# Patient Record
Sex: Male | Born: 1935 | Race: White | Hispanic: No | Marital: Married | Smoking: Former smoker
Health system: Southern US, Community
[De-identification: ages and names within clinical notes are randomized; demographics above are authoritative.]

## PROBLEM LIST (undated history)

## (undated) DIAGNOSIS — I10 Essential (primary) hypertension: Secondary | ICD-10-CM

## (undated) DIAGNOSIS — T4145XA Adverse effect of unspecified anesthetic, initial encounter: Secondary | ICD-10-CM

## (undated) DIAGNOSIS — N200 Calculus of kidney: Secondary | ICD-10-CM

## (undated) DIAGNOSIS — T8859XA Other complications of anesthesia, initial encounter: Secondary | ICD-10-CM

## (undated) HISTORY — PX: KNEE ARTHROSCOPY: SHX127

---

## 2009-02-12 ENCOUNTER — Ambulatory Visit: Payer: Self-pay | Admitting: Family Medicine

## 2009-12-16 ENCOUNTER — Ambulatory Visit: Payer: Self-pay

## 2010-01-14 ENCOUNTER — Ambulatory Visit: Payer: Self-pay | Admitting: General Practice

## 2010-05-16 ENCOUNTER — Ambulatory Visit: Payer: Self-pay | Admitting: General Practice

## 2014-10-06 DIAGNOSIS — M1712 Unilateral primary osteoarthritis, left knee: Secondary | ICD-10-CM | POA: Insufficient documentation

## 2014-11-12 ENCOUNTER — Other Ambulatory Visit: Payer: Self-pay | Admitting: Physician Assistant

## 2014-11-12 DIAGNOSIS — S83222D Peripheral tear of medial meniscus, current injury, left knee, subsequent encounter: Secondary | ICD-10-CM

## 2014-11-19 ENCOUNTER — Ambulatory Visit: Payer: Self-pay

## 2014-11-19 ENCOUNTER — Ambulatory Visit
Admission: RE | Admit: 2014-11-19 | Discharge: 2014-11-19 | Disposition: A | Discharge status: Home / Self Care | Payer: PPO | Source: Ambulatory Visit | Attending: Physician Assistant | Admitting: Physician Assistant

## 2014-11-19 DIAGNOSIS — M7122 Synovial cyst of popliteal space [Baker], left knee: Secondary | ICD-10-CM | POA: Insufficient documentation

## 2014-11-19 DIAGNOSIS — X58XXXD Exposure to other specified factors, subsequent encounter: Secondary | ICD-10-CM | POA: Insufficient documentation

## 2014-11-19 DIAGNOSIS — S83222D Peripheral tear of medial meniscus, current injury, left knee, subsequent encounter: Secondary | ICD-10-CM | POA: Insufficient documentation

## 2014-12-28 ENCOUNTER — Encounter
Admission: RE | Admit: 2014-12-28 | Discharge: 2014-12-28 | Disposition: A | Discharge status: Home / Self Care | Payer: PPO | Source: Ambulatory Visit | Attending: Orthopedic Surgery | Admitting: Orthopedic Surgery

## 2014-12-28 DIAGNOSIS — Z9109 Other allergy status, other than to drugs and biological substances: Secondary | ICD-10-CM | POA: Diagnosis not present

## 2014-12-28 DIAGNOSIS — I10 Essential (primary) hypertension: Secondary | ICD-10-CM | POA: Insufficient documentation

## 2014-12-28 DIAGNOSIS — Z01810 Encounter for preprocedural cardiovascular examination: Secondary | ICD-10-CM | POA: Insufficient documentation

## 2014-12-28 DIAGNOSIS — N200 Calculus of kidney: Secondary | ICD-10-CM | POA: Insufficient documentation

## 2014-12-28 DIAGNOSIS — M1712 Unilateral primary osteoarthritis, left knee: Secondary | ICD-10-CM | POA: Diagnosis not present

## 2014-12-28 HISTORY — DX: Adverse effect of unspecified anesthetic, initial encounter: T41.45XA

## 2014-12-28 HISTORY — DX: Essential (primary) hypertension: I10

## 2014-12-28 HISTORY — DX: Other complications of anesthesia, initial encounter: T88.59XA

## 2014-12-28 HISTORY — DX: Calculus of kidney: N20.0

## 2014-12-28 NOTE — Patient Instructions (Signed)
  Your procedure is scheduled on: 01/04/15 Fri Report to Day Surgery. To find out your arrival time please call [phone] between 1PM - 3PM on 01/01/15 Fri.  Remember: Instructions that are not followed completely may result in serious medical risk, up to and including death, or upon the discretion of your surgeon and anesthesiologist your surgery may need to be rescheduled.    _x___ 1. Do not eat food or drink liquids after midnight. No gum chewing or hard candies.     ____ 2. No Alcohol for 24 hours before or after surgery.   ____ 3. Bring all medications with you on the day of surgery if instructed.    __x__ 4. Notify your doctor if there is any change in your medical condition     (cold, fever, infections).     Do not wear jewelry, make-up, hairpins, clips or nail polish.  Do not wear lotions, powders, or perfumes. You may wear deodorant.  Do not shave 48 hours prior to surgery. Men may shave face and neck.  Do not bring valuables to the hospital.    [facility] is not responsible for any belongings or valuables.               Contacts, dentures or bridgework may not be worn into surgery.  Leave your suitcase in the car. After surgery it may be brought to your room.  For patients admitted to the hospital, discharge time is determined by your                treatment team.   Patients discharged the day of surgery will not be allowed to drive home.   Please read over the following fact sheets that you were given:      _x___ Take these medicines the morning of surgery with A SIP OF WATER:    1.lisinopril (PRINIVIL,ZESTRIL) 40 MG tablet  2.   3.   4.  5.  6.  ____ Fleet Enema (as directed)   ____ Use CHG Soap as directed  ____ Use inhalers on the day of surgery  ____ Stop metformin 2 days prior to surgery    ____ Take 1/2 of usual insulin dose the night before surgery and none on the morning of surgery.   _x___ Stop Coumadin/Plavix/aspirin on today  ____ Stop  Anti-inflammatories on    ____ Stop supplements until after surgery.    ____ Bring C-Pap to the hospital.

## 2015-01-04 ENCOUNTER — Encounter: Payer: Self-pay | Admitting: Anesthesiology

## 2015-01-04 ENCOUNTER — Ambulatory Visit: Payer: PPO | Admitting: Anesthesiology

## 2015-01-04 ENCOUNTER — Ambulatory Visit
Admission: RE | Admit: 2015-01-04 | Discharge: 2015-01-04 | Disposition: A | Discharge status: Home / Self Care | Payer: PPO | Source: Ambulatory Visit | Attending: Orthopedic Surgery | Admitting: Orthopedic Surgery

## 2015-01-04 ENCOUNTER — Encounter
Admission: RE | Disposition: A | Discharge status: Home / Self Care | Payer: Self-pay | Source: Ambulatory Visit | Attending: Orthopedic Surgery

## 2015-01-04 DIAGNOSIS — S83242A Other tear of medial meniscus, current injury, left knee, initial encounter: Secondary | ICD-10-CM | POA: Insufficient documentation

## 2015-01-04 DIAGNOSIS — M94262 Chondromalacia, left knee: Secondary | ICD-10-CM | POA: Diagnosis not present

## 2015-01-04 DIAGNOSIS — Z79899 Other long term (current) drug therapy: Secondary | ICD-10-CM | POA: Insufficient documentation

## 2015-01-04 DIAGNOSIS — X58XXXA Exposure to other specified factors, initial encounter: Secondary | ICD-10-CM | POA: Diagnosis not present

## 2015-01-04 DIAGNOSIS — M2392 Unspecified internal derangement of left knee: Secondary | ICD-10-CM | POA: Diagnosis present

## 2015-01-04 DIAGNOSIS — Z87891 Personal history of nicotine dependence: Secondary | ICD-10-CM | POA: Insufficient documentation

## 2015-01-04 DIAGNOSIS — I10 Essential (primary) hypertension: Secondary | ICD-10-CM | POA: Diagnosis not present

## 2015-01-04 DIAGNOSIS — Z7982 Long term (current) use of aspirin: Secondary | ICD-10-CM | POA: Diagnosis not present

## 2015-01-04 HISTORY — PX: KNEE ARTHROSCOPY: SHX127

## 2015-01-04 SURGERY — ARTHROSCOPY, KNEE
Anesthesia: General | Laterality: Left

## 2015-01-04 MED ORDER — MORPHINE SULFATE 10 MG/ML IJ SOLN
INTRAMUSCULAR | Status: AC
  Filled 2015-01-04: qty 1

## 2015-01-04 MED ORDER — BUPIVACAINE-EPINEPHRINE (PF) 0.25% -1:200000 IJ SOLN
INTRAMUSCULAR | Status: AC
  Filled 2015-01-04: qty 30

## 2015-01-04 MED ORDER — PROPOFOL 10 MG/ML IV BOLUS
INTRAVENOUS | Status: DC | PRN
  Administered 2015-01-04: 200 mg via INTRAVENOUS

## 2015-01-04 MED ORDER — ACETAMINOPHEN 10 MG/ML IV SOLN
INTRAVENOUS | Status: DC | PRN
  Administered 2015-01-04: 1000 mg via INTRAVENOUS

## 2015-01-04 MED ORDER — ONDANSETRON HCL 4 MG/2ML IJ SOLN: 4.0000 mg | Freq: Four times a day (QID) | INTRAMUSCULAR | Status: DC | PRN

## 2015-01-04 MED ORDER — ACETAMINOPHEN 10 MG/ML IV SOLN
INTRAVENOUS | Status: AC
  Filled 2015-01-04: qty 100

## 2015-01-04 MED ORDER — METOCLOPRAMIDE HCL 10 MG PO TABS: 5.0000 mg | ORAL_TABLET | Freq: Three times a day (TID) | ORAL | Status: DC | PRN

## 2015-01-04 MED ORDER — HYDROCODONE-ACETAMINOPHEN 5-325 MG PO TABS: 1.0000 | ORAL_TABLET | ORAL | Status: DC | PRN

## 2015-01-04 MED ORDER — FENTANYL CITRATE (PF) 100 MCG/2ML IJ SOLN
INTRAMUSCULAR | Status: DC | PRN
  Administered 2015-01-04 (×2): 50 ug via INTRAVENOUS

## 2015-01-04 MED ORDER — PHENYLEPHRINE HCL 10 MG/ML IJ SOLN
INTRAMUSCULAR | Status: DC | PRN
  Administered 2015-01-04: 100 ug via INTRAVENOUS
  Administered 2015-01-04: 50 ug via INTRAVENOUS
  Administered 2015-01-04 (×2): 100 ug via INTRAVENOUS

## 2015-01-04 MED ORDER — FAMOTIDINE 20 MG PO TABS
ORAL_TABLET | ORAL | Status: AC
  Filled 2015-01-04: qty 1

## 2015-01-04 MED ORDER — METOCLOPRAMIDE HCL 5 MG/ML IJ SOLN: 5.0000 mg | Freq: Three times a day (TID) | INTRAMUSCULAR | Status: DC | PRN

## 2015-01-04 MED ORDER — BUPIVACAINE-EPINEPHRINE (PF) 0.25% -1:200000 IJ SOLN
INTRAMUSCULAR | Status: DC | PRN
  Administered 2015-01-04: 30 mL via PERINEURAL

## 2015-01-04 MED ORDER — MORPHINE SULFATE 4 MG/ML IJ SOLN
INTRAMUSCULAR | Status: DC | PRN
  Administered 2015-01-04: 4 mg

## 2015-01-04 MED ORDER — LACTATED RINGERS IV SOLN
INTRAVENOUS | Status: DC
  Administered 2015-01-04: 16:00:00 via INTRAVENOUS

## 2015-01-04 MED ORDER — SODIUM CHLORIDE 0.9 % IV SOLN: INTRAVENOUS | Status: DC

## 2015-01-04 MED ORDER — EPHEDRINE SULFATE 50 MG/ML IJ SOLN
INTRAMUSCULAR | Status: DC | PRN
Start: 2015-01-04 — End: 2015-01-04
  Administered 2015-01-04: 5 mg via INTRAVENOUS
  Administered 2015-01-04: 10 mg via INTRAVENOUS
  Administered 2015-01-04 (×3): 5 mg via INTRAVENOUS

## 2015-01-04 MED ORDER — MORPHINE SULFATE 4 MG/ML IJ SOLN
INTRAMUSCULAR | Status: AC
  Filled 2015-01-04: qty 1

## 2015-01-04 MED ORDER — FAMOTIDINE 20 MG PO TABS
20.0000 mg | ORAL_TABLET | Freq: Once | ORAL | Status: AC
  Administered 2015-01-04: 20 mg via ORAL

## 2015-01-04 MED ORDER — LIDOCAINE HCL (CARDIAC) 20 MG/ML IV SOLN
INTRAVENOUS | Status: DC | PRN
  Administered 2015-01-04: 20 mg via INTRAVENOUS

## 2015-01-04 MED ORDER — ONDANSETRON HCL 4 MG PO TABS: 4.0000 mg | ORAL_TABLET | Freq: Four times a day (QID) | ORAL | Status: DC | PRN

## 2015-01-04 SURGICAL SUPPLY — 22 items
BLADE SHAVER 4.5 DBL SERAT CV (CUTTER) ×3 IMPLANT
BNDG ESMARK 6X12 TAN STRL LF (GAUZE/BANDAGES/DRESSINGS) ×3 IMPLANT
DRSG DERMACEA 8X12 NADH (GAUZE/BANDAGES/DRESSINGS) ×3 IMPLANT
DURAPREP 26ML APPLICATOR (WOUND CARE) ×6 IMPLANT
GAUZE SPONGE 4X4 12PLY STRL (GAUZE/BANDAGES/DRESSINGS) ×3 IMPLANT
GLOVE BIOGEL M STRL SZ7.5 (GLOVE) ×3 IMPLANT
GLOVE INDICATOR 8.0 STRL GRN (GLOVE) ×3 IMPLANT
GOWN STRL REUS W/ TWL LRG LVL3 (GOWN DISPOSABLE) ×1 IMPLANT
GOWN STRL REUS W/TWL LRG LVL3 (GOWN DISPOSABLE) ×2
GOWN STRL REUS W/TWL XL LVL4 (GOWN DISPOSABLE) ×3 IMPLANT
IV LACTATED RINGER IRRG 3000ML (IV SOLUTION) ×12
IV LR IRRIG 3000ML ARTHROMATIC (IV SOLUTION) ×6 IMPLANT
MANIFOLD NEPTUNE II (INSTRUMENTS) ×3 IMPLANT
PACK ARTHROSCOPY KNEE (MISCELLANEOUS) ×3 IMPLANT
SET TUBE SUCT SHAVER OUTFL 24K (TUBING) ×3 IMPLANT
SET TUBE TIP INTRA-ARTICULAR (MISCELLANEOUS) ×3 IMPLANT
STRAP SAFETY BODY (MISCELLANEOUS) ×3 IMPLANT
SUT ETHILON 3-0 FS-10 30 BLK (SUTURE) ×3
SUTURE EHLN 3-0 FS-10 30 BLK (SUTURE) ×1 IMPLANT
TUBING ARTHRO INFLOW-ONLY STRL (TUBING) ×3 IMPLANT
WAND HAND CNTRL MULTIVAC 50 (MISCELLANEOUS) ×3 IMPLANT
WRAP KNEE W/COLD PACKS 25.5X14 (SOFTGOODS) ×3 IMPLANT

## 2015-01-04 NOTE — Transfer of Care (Signed)
Immediate Anesthesia Transfer of Care Note  Patient: Dakota Parker  Procedure(s) Performed: Procedure(s): Left knee arthroscopy parital medial menisectomy, chondraplasty, (Left)  Patient Location: PACU  Anesthesia Type:General  Level of Consciousness: awake  Airway & Oxygen Therapy: Patient Spontanous Breathing and Patient connected to face mask oxygen  Post-op Assessment: Report given to RN and Post -op Vital signs reviewed and stable  Post vital signs: Reviewed and stable  Last Vitals:  Filed Vitals:   01/04/15 1907  BP: 114/67  Pulse: 68  Temp: 37.1 C  Resp: 12    Complications: No apparent anesthesia complications

## 2015-01-04 NOTE — H&P (Signed)
The patient has been re-examined, and the chart reviewed, and there have been no interval changes to the documented history and physical.    The risks, benefits, and alternatives have been discussed at length. The patient expressed understanding of the risks benefits and agreed with plans for surgical intervention.  James P. Hooten, Jr. M.D.    

## 2015-01-04 NOTE — Anesthesia Preprocedure Evaluation (Signed)
Anesthesia Evaluation  Patient identified by MRN, date of birth, ID band Patient awake    Reviewed: Allergy & Precautions, H&P , NPO status , Patient's Chart, lab work & pertinent test results, reviewed documented beta blocker date and time   History of Anesthesia Complications Negative for: history of anesthetic complications  Airway Mallampati: III  TM Distance: >3 FB Neck ROM: limited    Dental  (+) Poor Dentition   Pulmonary former smoker,  breath sounds clear to auscultation  Pulmonary exam normal       Cardiovascular Exercise Tolerance: Good hypertension, Normal cardiovascular examRhythm:regular Rate:Normal     Neuro/Psych negative neurological ROS  negative psych ROS   GI/Hepatic negative GI ROS, Neg liver ROS,   Endo/Other  negative endocrine ROS  Renal/GU Renal disease  negative genitourinary   Musculoskeletal   Abdominal   Peds  Hematology negative hematology ROS (+)   Anesthesia Other Findings Past Medical History:   Hypertension                                                                                 Kidney stones                                              Patient reports no Complication of anesthesia  Signs and symptoms suggestive of sleep apnea    Reproductive/Obstetrics negative OB ROS                             Anesthesia Physical Anesthesia Plan  ASA: II  Anesthesia Plan: General LMA   Post-op Pain Management:    Induction:   Airway Management Planned:   Additional Equipment:   Intra-op Plan:   Post-operative Plan:   Informed Consent: I have reviewed the patients History and Physical, chart, labs and discussed the procedure including the risks, benefits and alternatives for the proposed anesthesia with the patient or authorized representative who has indicated his/her understanding and acceptance.   Dental Advisory Given  Plan Discussed with:  Anesthesiologist, CRNA and Surgeon  Anesthesia Plan Comments:         Anesthesia Quick Evaluation

## 2015-01-04 NOTE — Op Note (Signed)
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   ASSISTANT: none  ANESTHESIA: general  ESTIMATED BLOOD LOSS: Minimal  TOURNIQUET TIME: Not used   DRAINS: none  IMPLANTS UTILIZED: None  INDICATIONS FOR SURGERY: Dakota Parker is a 81 y.o. year old male who has been seen for complaints of left knee pain. MRI demonstrated findings consistent with meniscal pathology. After discussion of the risks and benefits of surgical intervention, the patient expressed understanding of the risks benefits and agree with plans for left knee arthroscopy.   PROCEDURE IN DETAIL: The patient was brought into the operating room and, after adequate general anesthesia was achieved, a tourniquet was applied to the left thigh and the leg was placed in the leg holder. All bony prominences were well padded. The patient's left knee was cleaned and prepped with alcohol and Duraprep and draped in the usual sterile fashion. A "timeout" was performed as per usual protocol. The anticipated portal sites were injected with 0.25% Marcaine with epinephrine. An anterolateral incision was made and a cannula was inserted. A small effusion was evacuated and the knee was distended with fluid using the pump. The scope was advanced down the medial gutter into the medial compartment. Under visualization with the scope, an anteromedial portal was created and a hooked probe was inserted. The medial meniscus was visualized and probed. Mild fraying was noted along the posterior horn of the medial meniscus. There was a degenerative tear  involving the anterior horn of the medial meniscus. The tear was debrided using a 4.5 mm incisor shaver and then contoured using a 50 ArthroCare wand. The remaining rim of meniscus was probed and felt to be stable. The articular cartilage was visualized. There were grade 2-3 changes of chondromalacia involving primarily the medial femoral condyle. These areas were debrided and contoured using the 50 ArthroCare wand.  The scope was then advanced into the intercondylar notch. The anterior cruciate ligament was visualized and probed and felt to be intact. The scope was removed from the lateral portal and reinserted via the anteromedial portal to better visualize the lateral compartment. The lateral meniscus was visualized and probed. Minimal fraying was noted to inner aspect of the lateral meniscus with no gross tear or instability. The articular cartilage of the lateral compartment was visualized and was noted to be in excellent condition. Finally, the scope was advanced so as to visualize the patellofemoral articulation. Good patellar tracking was appreciaarticular surface was in excellent condition.he knee was irrigated with copius amounts of fluid and suctioned dry. The anterolateral portal was re-approximated with #3-0 nylon. A combination of 0.25% Marcaine with epinephrine and 4 mg of Morphine were injected via the scope. The scope was removed and the anteromedial portal was re-approximated with #3-0 nylon. A sterile dressing was applied followed by application of an ice wrap.  The patient tolerated the procedure well and was transported to the PACU in stable condition.  Avery Klingbeil P. Holley Bouche., M.D.

## 2015-01-04 NOTE — Brief Op Note (Signed)
01/04/2015  7:09 PM  PATIENT:  Dakota Parker  81 y.o. 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   Delay start of Pharmacological VTE agent (>24hrs) due to surgical blood loss or risk of bleeding: not applicable

## 2015-01-04 NOTE — Discharge Instructions (Signed)
°  Instructions after Knee Arthroscopy  ° ° James P. Hooten, Jr., M.D.    ° Dept. of Orthopaedics & Sports Medicine ° [facility] ° [address] ° [place], [place]  [address] ° ° Phone: [phone]   Fax: [phone] ° ° °DIET: °• Drink plenty of non-alcoholic fluids & begin a light diet. °• Resume your normal diet the day after surgery. ° °ACTIVITY:  °• You may use crutches or a walker with weight-bearing as tolerated, unless instructed otherwise. °• You may wean yourself off of the walker or crutches as tolerated.  °• Begin doing gentle exercises. Exercising will reduce the pain and swelling, increase motion, and prevent muscle weakness.   °• Avoid strenuous activities or athletics for a minimum of 4-6 weeks after arthroscopic surgery. °• Do not drive or operate any equipment until instructed. ° °WOUND CARE:  °• Place one to two pillows under the knee the first day or two when sitting or lying.  °• Continue to use the ice packs periodically to reduce pain and swelling. °• The small incisions in your knee are closed with nylon stitches. The stitches will be removed in the office. °• The bulky dressing may be removed on the second day after surgery. DO NOT TOUCH THE STITCHES. Put a Band-Aid over each stitch. Do NOT use any ointments or creams on the incisions.  °• You may bathe or shower after the stitches are removed at the first office visit following surgery. ° °MEDICATIONS: °• You may resume your regular medications. °• Please take the pain medication as prescribed. °• Do not take pain medication on an empty stomach. °• Do not drive or drink alcoholic beverages when taking pain medications. ° °CALL THE OFFICE FOR: °• Temperature above 101 degrees °• Excessive bleeding or drainage on the dressing. °• Excessive swelling, coldness, or paleness of the toes. °• Persistent nausea and vomiting. ° °FOLLOW-UP:  °• You should have an appointment to return to the office in 7-10 days after surgery.  °  °

## 2015-01-04 NOTE — Anesthesia Postprocedure Evaluation (Signed)
  Anesthesia Post-op Note  Patient: Dakota Parker  Procedure(s) Performed: Procedure(s): Left knee arthroscopy parital medial menisectomy, chondraplasty, (Left)  Anesthesia type:General LMA  Patient location: PACU  Post pain: Pain level controlled  Post assessment: Post-op Vital signs reviewed, Patient's Cardiovascular Status Stable, Respiratory Function Stable, Patent Airway and No signs of Nausea or vomiting  Post vital signs: Reviewed and stable  Last Vitals:  Filed Vitals:   01/04/15 1907  BP: 114/67  Pulse: 68  Temp: 37.1 C  Resp: 12    Level of consciousness: awake, alert  and patient cooperative  Complications: No apparent anesthesia complications

## 2015-01-04 NOTE — Anesthesia Procedure Notes (Signed)
Procedure Name: LMA Insertion Date/Time: 01/04/2015 5:40 PM Performed by: Aline Brochure Pre-anesthesia Checklist: Patient identified, Emergency Drugs available, Suction available and Patient being monitored Patient Re-evaluated:Patient Re-evaluated prior to inductionOxygen Delivery Method: Circle system utilized Preoxygenation: Pre-oxygenation with 100% oxygen Intubation Type: IV induction Ventilation: Mask ventilation without difficulty LMA: LMA inserted LMA Size: 4.5 Number of attempts: 1 Airway Equipment and Method: Patient positioned with wedge pillow Placement Confirmation: positive ETCO2 and breath sounds checked- equal and bilateral Tube secured with: Tape Dental Injury: Teeth and Oropharynx as per pre-operative assessment

## 2015-01-05 ENCOUNTER — Encounter: Payer: Self-pay | Admitting: Orthopedic Surgery

## 2015-06-15 DIAGNOSIS — C44219 Basal cell carcinoma of skin of left ear and external auricular canal: Secondary | ICD-10-CM | POA: Diagnosis not present

## 2015-06-15 DIAGNOSIS — L578 Other skin changes due to chronic exposure to nonionizing radiation: Secondary | ICD-10-CM | POA: Diagnosis not present

## 2015-06-15 DIAGNOSIS — C44319 Basal cell carcinoma of skin of other parts of face: Secondary | ICD-10-CM | POA: Diagnosis not present

## 2015-06-15 DIAGNOSIS — C44311 Basal cell carcinoma of skin of nose: Secondary | ICD-10-CM | POA: Diagnosis not present

## 2015-06-15 DIAGNOSIS — C4491 Basal cell carcinoma of skin, unspecified: Secondary | ICD-10-CM | POA: Diagnosis not present

## 2015-06-15 DIAGNOSIS — L908 Other atrophic disorders of skin: Secondary | ICD-10-CM | POA: Diagnosis not present

## 2015-06-15 DIAGNOSIS — C44212 Basal cell carcinoma of skin of right ear and external auricular canal: Secondary | ICD-10-CM | POA: Diagnosis not present

## 2015-06-15 DIAGNOSIS — L814 Other melanin hyperpigmentation: Secondary | ICD-10-CM | POA: Diagnosis not present

## 2015-07-01 DIAGNOSIS — H40003 Preglaucoma, unspecified, bilateral: Secondary | ICD-10-CM | POA: Diagnosis not present

## 2015-10-05 DIAGNOSIS — L821 Other seborrheic keratosis: Secondary | ICD-10-CM | POA: Diagnosis not present

## 2015-10-05 DIAGNOSIS — Z85828 Personal history of other malignant neoplasm of skin: Secondary | ICD-10-CM | POA: Diagnosis not present

## 2015-10-05 DIAGNOSIS — D1800 Hemangioma unspecified site: Secondary | ICD-10-CM | POA: Diagnosis not present

## 2015-10-05 DIAGNOSIS — L570 Actinic keratosis: Secondary | ICD-10-CM | POA: Diagnosis not present

## 2015-10-05 DIAGNOSIS — D485 Neoplasm of uncertain behavior of skin: Secondary | ICD-10-CM | POA: Diagnosis not present

## 2015-11-09 DIAGNOSIS — C4441 Basal cell carcinoma of skin of scalp and neck: Secondary | ICD-10-CM | POA: Diagnosis not present

## 2015-11-09 DIAGNOSIS — C4491 Basal cell carcinoma of skin, unspecified: Secondary | ICD-10-CM | POA: Diagnosis not present

## 2016-02-17 DIAGNOSIS — I10 Essential (primary) hypertension: Secondary | ICD-10-CM | POA: Diagnosis not present

## 2016-02-17 DIAGNOSIS — N183 Chronic kidney disease, stage 3 (moderate): Secondary | ICD-10-CM | POA: Diagnosis not present

## 2016-02-17 DIAGNOSIS — R0989 Other specified symptoms and signs involving the circulatory and respiratory systems: Secondary | ICD-10-CM | POA: Diagnosis not present

## 2016-02-17 DIAGNOSIS — E119 Type 2 diabetes mellitus without complications: Secondary | ICD-10-CM | POA: Diagnosis not present

## 2016-06-08 DIAGNOSIS — I10 Essential (primary) hypertension: Secondary | ICD-10-CM | POA: Diagnosis not present

## 2016-06-08 DIAGNOSIS — R0989 Other specified symptoms and signs involving the circulatory and respiratory systems: Secondary | ICD-10-CM | POA: Diagnosis not present

## 2016-06-08 DIAGNOSIS — Z87891 Personal history of nicotine dependence: Secondary | ICD-10-CM | POA: Diagnosis not present

## 2016-06-08 DIAGNOSIS — N183 Chronic kidney disease, stage 3 (moderate): Secondary | ICD-10-CM | POA: Diagnosis not present

## 2016-06-08 DIAGNOSIS — Z23 Encounter for immunization: Secondary | ICD-10-CM | POA: Diagnosis not present

## 2016-06-08 DIAGNOSIS — J90 Pleural effusion, not elsewhere classified: Secondary | ICD-10-CM | POA: Diagnosis not present

## 2016-06-08 DIAGNOSIS — E119 Type 2 diabetes mellitus without complications: Secondary | ICD-10-CM | POA: Diagnosis not present

## 2016-06-22 DIAGNOSIS — N183 Chronic kidney disease, stage 3 (moderate): Secondary | ICD-10-CM | POA: Diagnosis not present

## 2016-06-22 DIAGNOSIS — R0989 Other specified symptoms and signs involving the circulatory and respiratory systems: Secondary | ICD-10-CM | POA: Diagnosis not present

## 2016-06-22 DIAGNOSIS — R7309 Other abnormal glucose: Secondary | ICD-10-CM | POA: Diagnosis not present

## 2016-06-22 DIAGNOSIS — E785 Hyperlipidemia, unspecified: Secondary | ICD-10-CM | POA: Diagnosis not present

## 2016-06-22 DIAGNOSIS — I10 Essential (primary) hypertension: Secondary | ICD-10-CM | POA: Diagnosis not present

## 2016-10-24 DIAGNOSIS — Z85828 Personal history of other malignant neoplasm of skin: Secondary | ICD-10-CM | POA: Diagnosis not present

## 2016-10-24 DIAGNOSIS — L570 Actinic keratosis: Secondary | ICD-10-CM | POA: Diagnosis not present

## 2017-02-06 DIAGNOSIS — H02826 Cysts of left eye, unspecified eyelid: Secondary | ICD-10-CM | POA: Diagnosis not present

## 2017-07-20 DIAGNOSIS — B9689 Other specified bacterial agents as the cause of diseases classified elsewhere: Secondary | ICD-10-CM | POA: Diagnosis not present

## 2017-07-20 DIAGNOSIS — J019 Acute sinusitis, unspecified: Secondary | ICD-10-CM | POA: Diagnosis not present

## 2017-07-20 DIAGNOSIS — J209 Acute bronchitis, unspecified: Secondary | ICD-10-CM | POA: Diagnosis not present

## 2017-12-25 DIAGNOSIS — E119 Type 2 diabetes mellitus without complications: Secondary | ICD-10-CM | POA: Diagnosis not present

## 2017-12-25 DIAGNOSIS — E785 Hyperlipidemia, unspecified: Secondary | ICD-10-CM | POA: Diagnosis not present

## 2017-12-25 DIAGNOSIS — I10 Essential (primary) hypertension: Secondary | ICD-10-CM | POA: Diagnosis not present

## 2017-12-25 DIAGNOSIS — N183 Chronic kidney disease, stage 3 (moderate): Secondary | ICD-10-CM | POA: Diagnosis not present

## 2018-02-07 DIAGNOSIS — N183 Chronic kidney disease, stage 3 (moderate): Secondary | ICD-10-CM | POA: Diagnosis not present

## 2018-02-07 DIAGNOSIS — I10 Essential (primary) hypertension: Secondary | ICD-10-CM | POA: Diagnosis not present

## 2018-02-07 DIAGNOSIS — E119 Type 2 diabetes mellitus without complications: Secondary | ICD-10-CM | POA: Diagnosis not present

## 2018-02-12 DIAGNOSIS — E119 Type 2 diabetes mellitus without complications: Secondary | ICD-10-CM | POA: Diagnosis not present

## 2018-02-12 DIAGNOSIS — I10 Essential (primary) hypertension: Secondary | ICD-10-CM | POA: Diagnosis not present

## 2018-02-12 DIAGNOSIS — N183 Chronic kidney disease, stage 3 (moderate): Secondary | ICD-10-CM | POA: Diagnosis not present

## 2018-03-20 DIAGNOSIS — I10 Essential (primary) hypertension: Secondary | ICD-10-CM | POA: Diagnosis not present

## 2018-03-20 DIAGNOSIS — E119 Type 2 diabetes mellitus without complications: Secondary | ICD-10-CM | POA: Diagnosis not present

## 2018-04-24 DIAGNOSIS — E119 Type 2 diabetes mellitus without complications: Secondary | ICD-10-CM | POA: Diagnosis not present

## 2018-04-24 DIAGNOSIS — N183 Chronic kidney disease, stage 3 (moderate): Secondary | ICD-10-CM | POA: Diagnosis not present

## 2018-04-24 DIAGNOSIS — Z23 Encounter for immunization: Secondary | ICD-10-CM | POA: Diagnosis not present

## 2018-04-25 DIAGNOSIS — E1121 Type 2 diabetes mellitus with diabetic nephropathy: Secondary | ICD-10-CM | POA: Diagnosis not present

## 2018-05-31 DIAGNOSIS — I10 Essential (primary) hypertension: Secondary | ICD-10-CM | POA: Diagnosis not present

## 2018-05-31 DIAGNOSIS — E785 Hyperlipidemia, unspecified: Secondary | ICD-10-CM | POA: Diagnosis not present

## 2018-07-09 DIAGNOSIS — E1121 Type 2 diabetes mellitus with diabetic nephropathy: Secondary | ICD-10-CM | POA: Diagnosis not present

## 2018-07-30 DIAGNOSIS — I10 Essential (primary) hypertension: Secondary | ICD-10-CM | POA: Diagnosis not present

## 2018-07-30 DIAGNOSIS — Z1389 Encounter for screening for other disorder: Secondary | ICD-10-CM | POA: Diagnosis not present

## 2018-07-30 DIAGNOSIS — Z0000 Encounter for general adult medical examination without abnormal findings: Secondary | ICD-10-CM | POA: Diagnosis not present

## 2018-07-30 DIAGNOSIS — E1121 Type 2 diabetes mellitus with diabetic nephropathy: Secondary | ICD-10-CM | POA: Diagnosis not present

## 2018-08-29 DIAGNOSIS — E1121 Type 2 diabetes mellitus with diabetic nephropathy: Secondary | ICD-10-CM | POA: Diagnosis not present

## 2018-08-29 DIAGNOSIS — I10 Essential (primary) hypertension: Secondary | ICD-10-CM | POA: Diagnosis not present

## 2018-10-01 DIAGNOSIS — E785 Hyperlipidemia, unspecified: Secondary | ICD-10-CM | POA: Diagnosis not present

## 2018-10-01 DIAGNOSIS — I10 Essential (primary) hypertension: Secondary | ICD-10-CM | POA: Diagnosis not present

## 2018-10-01 DIAGNOSIS — E1121 Type 2 diabetes mellitus with diabetic nephropathy: Secondary | ICD-10-CM | POA: Diagnosis not present

## 2018-10-01 DIAGNOSIS — N183 Chronic kidney disease, stage 3 (moderate): Secondary | ICD-10-CM | POA: Diagnosis not present

## 2018-10-30 DIAGNOSIS — E1121 Type 2 diabetes mellitus with diabetic nephropathy: Secondary | ICD-10-CM | POA: Diagnosis not present

## 2018-10-30 DIAGNOSIS — I10 Essential (primary) hypertension: Secondary | ICD-10-CM | POA: Diagnosis not present

## 2018-11-18 DIAGNOSIS — E1121 Type 2 diabetes mellitus with diabetic nephropathy: Secondary | ICD-10-CM | POA: Diagnosis not present

## 2018-11-18 DIAGNOSIS — I10 Essential (primary) hypertension: Secondary | ICD-10-CM | POA: Diagnosis not present

## 2018-11-26 DIAGNOSIS — E1121 Type 2 diabetes mellitus with diabetic nephropathy: Secondary | ICD-10-CM | POA: Diagnosis not present

## 2018-11-26 DIAGNOSIS — E785 Hyperlipidemia, unspecified: Secondary | ICD-10-CM | POA: Diagnosis not present

## 2018-11-26 DIAGNOSIS — N183 Chronic kidney disease, stage 3 (moderate): Secondary | ICD-10-CM | POA: Diagnosis not present

## 2018-11-26 DIAGNOSIS — I10 Essential (primary) hypertension: Secondary | ICD-10-CM | POA: Diagnosis not present

## 2018-11-27 DIAGNOSIS — I10 Essential (primary) hypertension: Secondary | ICD-10-CM | POA: Diagnosis not present

## 2018-11-27 DIAGNOSIS — E1121 Type 2 diabetes mellitus with diabetic nephropathy: Secondary | ICD-10-CM | POA: Diagnosis not present

## 2018-11-27 DIAGNOSIS — E785 Hyperlipidemia, unspecified: Secondary | ICD-10-CM | POA: Diagnosis not present

## 2018-11-29 DIAGNOSIS — C44319 Basal cell carcinoma of skin of other parts of face: Secondary | ICD-10-CM | POA: Diagnosis not present

## 2018-11-29 DIAGNOSIS — C44311 Basal cell carcinoma of skin of nose: Secondary | ICD-10-CM | POA: Diagnosis not present

## 2018-11-29 DIAGNOSIS — D489 Neoplasm of uncertain behavior, unspecified: Secondary | ICD-10-CM | POA: Diagnosis not present

## 2018-11-29 DIAGNOSIS — Z85828 Personal history of other malignant neoplasm of skin: Secondary | ICD-10-CM | POA: Diagnosis not present

## 2019-01-08 DIAGNOSIS — I10 Essential (primary) hypertension: Secondary | ICD-10-CM | POA: Diagnosis not present

## 2019-01-08 DIAGNOSIS — E1121 Type 2 diabetes mellitus with diabetic nephropathy: Secondary | ICD-10-CM | POA: Diagnosis not present

## 2019-01-20 DIAGNOSIS — Z85828 Personal history of other malignant neoplasm of skin: Secondary | ICD-10-CM | POA: Diagnosis not present

## 2019-01-20 DIAGNOSIS — C44311 Basal cell carcinoma of skin of nose: Secondary | ICD-10-CM | POA: Diagnosis not present

## 2019-01-20 DIAGNOSIS — C44319 Basal cell carcinoma of skin of other parts of face: Secondary | ICD-10-CM | POA: Diagnosis not present

## 2019-02-07 DIAGNOSIS — E1121 Type 2 diabetes mellitus with diabetic nephropathy: Secondary | ICD-10-CM | POA: Diagnosis not present

## 2019-02-07 DIAGNOSIS — N183 Chronic kidney disease, stage 3 (moderate): Secondary | ICD-10-CM | POA: Diagnosis not present

## 2019-03-05 DIAGNOSIS — I10 Essential (primary) hypertension: Secondary | ICD-10-CM | POA: Diagnosis not present

## 2019-03-05 DIAGNOSIS — Z1329 Encounter for screening for other suspected endocrine disorder: Secondary | ICD-10-CM | POA: Diagnosis not present

## 2019-03-05 DIAGNOSIS — E785 Hyperlipidemia, unspecified: Secondary | ICD-10-CM | POA: Diagnosis not present

## 2019-03-05 DIAGNOSIS — N183 Chronic kidney disease, stage 3 (moderate): Secondary | ICD-10-CM | POA: Diagnosis not present

## 2019-03-05 DIAGNOSIS — Z23 Encounter for immunization: Secondary | ICD-10-CM | POA: Diagnosis not present

## 2019-03-05 DIAGNOSIS — E1121 Type 2 diabetes mellitus with diabetic nephropathy: Secondary | ICD-10-CM | POA: Diagnosis not present

## 2019-03-06 DIAGNOSIS — N183 Chronic kidney disease, stage 3 (moderate): Secondary | ICD-10-CM | POA: Diagnosis not present

## 2019-03-06 DIAGNOSIS — I10 Essential (primary) hypertension: Secondary | ICD-10-CM | POA: Diagnosis not present

## 2019-03-31 DIAGNOSIS — I10 Essential (primary) hypertension: Secondary | ICD-10-CM | POA: Diagnosis not present

## 2019-03-31 DIAGNOSIS — E1121 Type 2 diabetes mellitus with diabetic nephropathy: Secondary | ICD-10-CM | POA: Diagnosis not present

## 2019-03-31 DIAGNOSIS — E785 Hyperlipidemia, unspecified: Secondary | ICD-10-CM | POA: Diagnosis not present

## 2019-04-22 DIAGNOSIS — E785 Hyperlipidemia, unspecified: Secondary | ICD-10-CM | POA: Diagnosis not present

## 2019-04-22 DIAGNOSIS — E1121 Type 2 diabetes mellitus with diabetic nephropathy: Secondary | ICD-10-CM | POA: Diagnosis not present

## 2019-04-22 DIAGNOSIS — N1832 Chronic kidney disease, stage 3b: Secondary | ICD-10-CM | POA: Diagnosis not present

## 2019-04-22 DIAGNOSIS — I10 Essential (primary) hypertension: Secondary | ICD-10-CM | POA: Diagnosis not present

## 2019-05-29 DIAGNOSIS — I10 Essential (primary) hypertension: Secondary | ICD-10-CM | POA: Diagnosis not present

## 2019-05-29 DIAGNOSIS — E1121 Type 2 diabetes mellitus with diabetic nephropathy: Secondary | ICD-10-CM | POA: Diagnosis not present

## 2019-06-16 DIAGNOSIS — D492 Neoplasm of unspecified behavior of bone, soft tissue, and skin: Secondary | ICD-10-CM | POA: Diagnosis not present

## 2019-06-16 DIAGNOSIS — R238 Other skin changes: Secondary | ICD-10-CM | POA: Diagnosis not present

## 2019-06-16 DIAGNOSIS — C44319 Basal cell carcinoma of skin of other parts of face: Secondary | ICD-10-CM | POA: Diagnosis not present

## 2019-06-16 DIAGNOSIS — Z85828 Personal history of other malignant neoplasm of skin: Secondary | ICD-10-CM | POA: Diagnosis not present

## 2019-06-24 DIAGNOSIS — E785 Hyperlipidemia, unspecified: Secondary | ICD-10-CM | POA: Diagnosis not present

## 2019-06-24 DIAGNOSIS — E1121 Type 2 diabetes mellitus with diabetic nephropathy: Secondary | ICD-10-CM | POA: Diagnosis not present

## 2019-06-24 DIAGNOSIS — I10 Essential (primary) hypertension: Secondary | ICD-10-CM | POA: Diagnosis not present

## 2019-07-02 DIAGNOSIS — Z23 Encounter for immunization: Secondary | ICD-10-CM | POA: Diagnosis not present

## 2019-07-21 DIAGNOSIS — L739 Follicular disorder, unspecified: Secondary | ICD-10-CM | POA: Diagnosis not present

## 2019-07-21 DIAGNOSIS — C44310 Basal cell carcinoma of skin of unspecified parts of face: Secondary | ICD-10-CM | POA: Diagnosis not present

## 2019-07-30 DIAGNOSIS — Z23 Encounter for immunization: Secondary | ICD-10-CM | POA: Diagnosis not present

## 2019-08-05 DIAGNOSIS — I10 Essential (primary) hypertension: Secondary | ICD-10-CM | POA: Diagnosis not present

## 2019-08-05 DIAGNOSIS — E1121 Type 2 diabetes mellitus with diabetic nephropathy: Secondary | ICD-10-CM | POA: Diagnosis not present

## 2019-09-02 DIAGNOSIS — I10 Essential (primary) hypertension: Secondary | ICD-10-CM | POA: Diagnosis not present

## 2019-09-02 DIAGNOSIS — E785 Hyperlipidemia, unspecified: Secondary | ICD-10-CM | POA: Diagnosis not present

## 2019-09-30 DIAGNOSIS — E1121 Type 2 diabetes mellitus with diabetic nephropathy: Secondary | ICD-10-CM | POA: Diagnosis not present

## 2019-09-30 DIAGNOSIS — I10 Essential (primary) hypertension: Secondary | ICD-10-CM | POA: Diagnosis not present

## 2019-11-14 DIAGNOSIS — C4491 Basal cell carcinoma of skin, unspecified: Secondary | ICD-10-CM | POA: Diagnosis not present

## 2019-12-08 DIAGNOSIS — I10 Essential (primary) hypertension: Secondary | ICD-10-CM | POA: Diagnosis not present

## 2019-12-08 DIAGNOSIS — E1121 Type 2 diabetes mellitus with diabetic nephropathy: Secondary | ICD-10-CM | POA: Diagnosis not present

## 2020-01-29 DIAGNOSIS — I10 Essential (primary) hypertension: Secondary | ICD-10-CM | POA: Diagnosis not present

## 2020-01-29 DIAGNOSIS — E1121 Type 2 diabetes mellitus with diabetic nephropathy: Secondary | ICD-10-CM | POA: Diagnosis not present

## 2020-03-05 DIAGNOSIS — N1832 Chronic kidney disease, stage 3b: Secondary | ICD-10-CM | POA: Diagnosis not present

## 2020-03-05 DIAGNOSIS — I10 Essential (primary) hypertension: Secondary | ICD-10-CM | POA: Diagnosis not present

## 2020-03-31 DIAGNOSIS — E1121 Type 2 diabetes mellitus with diabetic nephropathy: Secondary | ICD-10-CM | POA: Diagnosis not present

## 2020-03-31 DIAGNOSIS — N1832 Chronic kidney disease, stage 3b: Secondary | ICD-10-CM | POA: Diagnosis not present

## 2020-04-15 DIAGNOSIS — E785 Hyperlipidemia, unspecified: Secondary | ICD-10-CM | POA: Diagnosis not present

## 2020-04-15 DIAGNOSIS — Z23 Encounter for immunization: Secondary | ICD-10-CM | POA: Diagnosis not present

## 2020-04-15 DIAGNOSIS — N1832 Chronic kidney disease, stage 3b: Secondary | ICD-10-CM | POA: Diagnosis not present

## 2020-04-15 DIAGNOSIS — I10 Essential (primary) hypertension: Secondary | ICD-10-CM | POA: Diagnosis not present

## 2020-04-15 DIAGNOSIS — E1121 Type 2 diabetes mellitus with diabetic nephropathy: Secondary | ICD-10-CM | POA: Diagnosis not present

## 2020-04-20 DIAGNOSIS — E785 Hyperlipidemia, unspecified: Secondary | ICD-10-CM | POA: Diagnosis not present

## 2020-04-20 DIAGNOSIS — N1832 Chronic kidney disease, stage 3b: Secondary | ICD-10-CM | POA: Diagnosis not present

## 2020-04-20 DIAGNOSIS — I10 Essential (primary) hypertension: Secondary | ICD-10-CM | POA: Diagnosis not present

## 2020-04-20 DIAGNOSIS — E1121 Type 2 diabetes mellitus with diabetic nephropathy: Secondary | ICD-10-CM | POA: Diagnosis not present

## 2020-04-29 DIAGNOSIS — I10 Essential (primary) hypertension: Secondary | ICD-10-CM | POA: Diagnosis not present

## 2020-04-29 DIAGNOSIS — E1121 Type 2 diabetes mellitus with diabetic nephropathy: Secondary | ICD-10-CM | POA: Diagnosis not present

## 2020-05-27 DIAGNOSIS — E1122 Type 2 diabetes mellitus with diabetic chronic kidney disease: Secondary | ICD-10-CM | POA: Diagnosis not present

## 2020-05-27 DIAGNOSIS — N1832 Chronic kidney disease, stage 3b: Secondary | ICD-10-CM | POA: Diagnosis not present

## 2020-05-27 DIAGNOSIS — I10 Essential (primary) hypertension: Secondary | ICD-10-CM | POA: Diagnosis not present

## 2020-06-15 DIAGNOSIS — N400 Enlarged prostate without lower urinary tract symptoms: Secondary | ICD-10-CM | POA: Diagnosis not present

## 2020-06-15 DIAGNOSIS — N1832 Chronic kidney disease, stage 3b: Secondary | ICD-10-CM | POA: Diagnosis not present

## 2020-06-15 DIAGNOSIS — E1122 Type 2 diabetes mellitus with diabetic chronic kidney disease: Secondary | ICD-10-CM | POA: Diagnosis not present

## 2020-06-17 DIAGNOSIS — I10 Essential (primary) hypertension: Secondary | ICD-10-CM | POA: Insufficient documentation

## 2020-06-17 DIAGNOSIS — E119 Type 2 diabetes mellitus without complications: Secondary | ICD-10-CM | POA: Diagnosis not present

## 2020-06-17 DIAGNOSIS — N184 Chronic kidney disease, stage 4 (severe): Secondary | ICD-10-CM | POA: Insufficient documentation

## 2020-06-17 DIAGNOSIS — E538 Deficiency of other specified B group vitamins: Secondary | ICD-10-CM | POA: Diagnosis not present

## 2020-06-17 DIAGNOSIS — Z125 Encounter for screening for malignant neoplasm of prostate: Secondary | ICD-10-CM | POA: Diagnosis not present

## 2020-06-17 DIAGNOSIS — Z23 Encounter for immunization: Secondary | ICD-10-CM | POA: Diagnosis not present

## 2020-06-17 DIAGNOSIS — Z0000 Encounter for general adult medical examination without abnormal findings: Secondary | ICD-10-CM | POA: Diagnosis not present

## 2020-06-17 DIAGNOSIS — N1832 Chronic kidney disease, stage 3b: Secondary | ICD-10-CM | POA: Diagnosis not present

## 2020-06-17 DIAGNOSIS — Z79899 Other long term (current) drug therapy: Secondary | ICD-10-CM | POA: Diagnosis not present

## 2020-06-18 DIAGNOSIS — L814 Other melanin hyperpigmentation: Secondary | ICD-10-CM | POA: Diagnosis not present

## 2020-06-18 DIAGNOSIS — D492 Neoplasm of unspecified behavior of bone, soft tissue, and skin: Secondary | ICD-10-CM | POA: Diagnosis not present

## 2020-06-18 DIAGNOSIS — D229 Melanocytic nevi, unspecified: Secondary | ICD-10-CM | POA: Diagnosis not present

## 2020-06-18 DIAGNOSIS — L821 Other seborrheic keratosis: Secondary | ICD-10-CM | POA: Diagnosis not present

## 2020-06-18 DIAGNOSIS — Z85828 Personal history of other malignant neoplasm of skin: Secondary | ICD-10-CM | POA: Diagnosis not present

## 2020-06-18 DIAGNOSIS — C44311 Basal cell carcinoma of skin of nose: Secondary | ICD-10-CM | POA: Diagnosis not present

## 2020-06-18 DIAGNOSIS — L578 Other skin changes due to chronic exposure to nonionizing radiation: Secondary | ICD-10-CM | POA: Diagnosis not present

## 2020-08-24 DIAGNOSIS — I10 Essential (primary) hypertension: Secondary | ICD-10-CM | POA: Diagnosis not present

## 2020-08-24 DIAGNOSIS — E1122 Type 2 diabetes mellitus with diabetic chronic kidney disease: Secondary | ICD-10-CM | POA: Diagnosis not present

## 2020-08-24 DIAGNOSIS — N1832 Chronic kidney disease, stage 3b: Secondary | ICD-10-CM | POA: Diagnosis not present

## 2020-12-16 DIAGNOSIS — E119 Type 2 diabetes mellitus without complications: Secondary | ICD-10-CM | POA: Diagnosis not present

## 2020-12-23 DIAGNOSIS — E119 Type 2 diabetes mellitus without complications: Secondary | ICD-10-CM | POA: Diagnosis not present

## 2020-12-23 DIAGNOSIS — Z79899 Other long term (current) drug therapy: Secondary | ICD-10-CM | POA: Diagnosis not present

## 2020-12-23 DIAGNOSIS — N1832 Chronic kidney disease, stage 3b: Secondary | ICD-10-CM | POA: Diagnosis not present

## 2021-02-11 DIAGNOSIS — C4491 Basal cell carcinoma of skin, unspecified: Secondary | ICD-10-CM | POA: Diagnosis not present

## 2021-02-21 DIAGNOSIS — D2239 Melanocytic nevi of other parts of face: Secondary | ICD-10-CM | POA: Diagnosis not present

## 2021-02-21 DIAGNOSIS — I10 Essential (primary) hypertension: Secondary | ICD-10-CM | POA: Diagnosis not present

## 2021-02-21 DIAGNOSIS — C44311 Basal cell carcinoma of skin of nose: Secondary | ICD-10-CM | POA: Diagnosis not present

## 2021-02-21 DIAGNOSIS — D485 Neoplasm of uncertain behavior of skin: Secondary | ICD-10-CM | POA: Diagnosis not present

## 2021-02-21 DIAGNOSIS — C44319 Basal cell carcinoma of skin of other parts of face: Secondary | ICD-10-CM | POA: Diagnosis not present

## 2021-02-21 DIAGNOSIS — N1832 Chronic kidney disease, stage 3b: Secondary | ICD-10-CM | POA: Diagnosis not present

## 2021-02-21 DIAGNOSIS — E1122 Type 2 diabetes mellitus with diabetic chronic kidney disease: Secondary | ICD-10-CM | POA: Diagnosis not present

## 2021-03-08 DIAGNOSIS — C44319 Basal cell carcinoma of skin of other parts of face: Secondary | ICD-10-CM | POA: Diagnosis not present

## 2021-03-08 DIAGNOSIS — C44311 Basal cell carcinoma of skin of nose: Secondary | ICD-10-CM | POA: Diagnosis not present

## 2021-03-16 DIAGNOSIS — E119 Type 2 diabetes mellitus without complications: Secondary | ICD-10-CM | POA: Diagnosis not present

## 2021-03-16 DIAGNOSIS — C44311 Basal cell carcinoma of skin of nose: Secondary | ICD-10-CM | POA: Diagnosis not present

## 2021-03-16 DIAGNOSIS — I10 Essential (primary) hypertension: Secondary | ICD-10-CM | POA: Diagnosis not present

## 2021-03-16 DIAGNOSIS — C4491 Basal cell carcinoma of skin, unspecified: Secondary | ICD-10-CM | POA: Diagnosis not present

## 2021-03-16 DIAGNOSIS — C44319 Basal cell carcinoma of skin of other parts of face: Secondary | ICD-10-CM | POA: Diagnosis not present

## 2021-03-16 DIAGNOSIS — E785 Hyperlipidemia, unspecified: Secondary | ICD-10-CM | POA: Diagnosis not present

## 2021-03-16 DIAGNOSIS — Z510 Encounter for antineoplastic radiation therapy: Secondary | ICD-10-CM | POA: Diagnosis not present

## 2021-03-16 DIAGNOSIS — Z87891 Personal history of nicotine dependence: Secondary | ICD-10-CM | POA: Diagnosis not present

## 2021-03-28 DIAGNOSIS — C44319 Basal cell carcinoma of skin of other parts of face: Secondary | ICD-10-CM | POA: Diagnosis not present

## 2021-03-28 DIAGNOSIS — C4491 Basal cell carcinoma of skin, unspecified: Secondary | ICD-10-CM

## 2021-03-28 HISTORY — DX: Basal cell carcinoma of skin, unspecified: C44.91

## 2021-03-30 DIAGNOSIS — C4481 Basal cell carcinoma of overlapping sites of skin: Secondary | ICD-10-CM | POA: Diagnosis not present

## 2021-04-12 DIAGNOSIS — C4481 Basal cell carcinoma of overlapping sites of skin: Secondary | ICD-10-CM | POA: Diagnosis not present

## 2021-04-12 DIAGNOSIS — C44319 Basal cell carcinoma of skin of other parts of face: Secondary | ICD-10-CM | POA: Diagnosis not present

## 2021-04-12 DIAGNOSIS — E785 Hyperlipidemia, unspecified: Secondary | ICD-10-CM | POA: Diagnosis not present

## 2021-04-12 DIAGNOSIS — Z87891 Personal history of nicotine dependence: Secondary | ICD-10-CM | POA: Diagnosis not present

## 2021-04-12 DIAGNOSIS — E119 Type 2 diabetes mellitus without complications: Secondary | ICD-10-CM | POA: Diagnosis not present

## 2021-04-12 DIAGNOSIS — Z510 Encounter for antineoplastic radiation therapy: Secondary | ICD-10-CM | POA: Diagnosis not present

## 2021-04-12 DIAGNOSIS — C44311 Basal cell carcinoma of skin of nose: Secondary | ICD-10-CM | POA: Diagnosis not present

## 2021-04-12 DIAGNOSIS — I10 Essential (primary) hypertension: Secondary | ICD-10-CM | POA: Diagnosis not present

## 2021-04-13 DIAGNOSIS — C4481 Basal cell carcinoma of overlapping sites of skin: Secondary | ICD-10-CM | POA: Diagnosis not present

## 2021-04-14 DIAGNOSIS — C4481 Basal cell carcinoma of overlapping sites of skin: Secondary | ICD-10-CM | POA: Diagnosis not present

## 2021-04-18 DIAGNOSIS — C4481 Basal cell carcinoma of overlapping sites of skin: Secondary | ICD-10-CM | POA: Diagnosis not present

## 2021-04-25 DIAGNOSIS — C4481 Basal cell carcinoma of overlapping sites of skin: Secondary | ICD-10-CM | POA: Diagnosis not present

## 2021-05-02 DIAGNOSIS — C4481 Basal cell carcinoma of overlapping sites of skin: Secondary | ICD-10-CM | POA: Diagnosis not present

## 2021-05-11 DIAGNOSIS — C4481 Basal cell carcinoma of overlapping sites of skin: Secondary | ICD-10-CM | POA: Diagnosis not present

## 2021-06-27 DIAGNOSIS — Z7189 Other specified counseling: Secondary | ICD-10-CM | POA: Diagnosis not present

## 2021-06-27 DIAGNOSIS — Z48817 Encounter for surgical aftercare following surgery on the skin and subcutaneous tissue: Secondary | ICD-10-CM | POA: Diagnosis not present

## 2021-06-27 DIAGNOSIS — Z923 Personal history of irradiation: Secondary | ICD-10-CM | POA: Diagnosis not present

## 2021-06-27 DIAGNOSIS — L821 Other seborrheic keratosis: Secondary | ICD-10-CM | POA: Diagnosis not present

## 2021-06-27 DIAGNOSIS — D485 Neoplasm of uncertain behavior of skin: Secondary | ICD-10-CM | POA: Diagnosis not present

## 2021-06-29 DIAGNOSIS — N1832 Chronic kidney disease, stage 3b: Secondary | ICD-10-CM | POA: Diagnosis not present

## 2021-06-29 DIAGNOSIS — E119 Type 2 diabetes mellitus without complications: Secondary | ICD-10-CM | POA: Diagnosis not present

## 2021-06-29 DIAGNOSIS — E1122 Type 2 diabetes mellitus with diabetic chronic kidney disease: Secondary | ICD-10-CM | POA: Diagnosis not present

## 2021-06-29 DIAGNOSIS — Z0000 Encounter for general adult medical examination without abnormal findings: Secondary | ICD-10-CM | POA: Diagnosis not present

## 2021-06-29 DIAGNOSIS — Z125 Encounter for screening for malignant neoplasm of prostate: Secondary | ICD-10-CM | POA: Diagnosis not present

## 2021-06-29 DIAGNOSIS — H579 Unspecified disorder of eye and adnexa: Secondary | ICD-10-CM | POA: Diagnosis not present

## 2021-07-01 ENCOUNTER — Other Ambulatory Visit: Payer: Self-pay | Admitting: Internal Medicine

## 2021-07-01 DIAGNOSIS — N390 Urinary tract infection, site not specified: Secondary | ICD-10-CM

## 2021-07-01 DIAGNOSIS — N1339 Other hydronephrosis: Secondary | ICD-10-CM

## 2021-07-05 ENCOUNTER — Other Ambulatory Visit: Payer: Self-pay | Admitting: Internal Medicine

## 2021-07-05 DIAGNOSIS — N2889 Other specified disorders of kidney and ureter: Secondary | ICD-10-CM

## 2021-07-13 ENCOUNTER — Ambulatory Visit: Payer: PPO

## 2021-07-13 DIAGNOSIS — C44319 Basal cell carcinoma of skin of other parts of face: Secondary | ICD-10-CM | POA: Diagnosis not present

## 2021-07-13 DIAGNOSIS — E119 Type 2 diabetes mellitus without complications: Secondary | ICD-10-CM | POA: Diagnosis not present

## 2021-07-13 DIAGNOSIS — Z87891 Personal history of nicotine dependence: Secondary | ICD-10-CM | POA: Diagnosis not present

## 2021-07-13 DIAGNOSIS — C44311 Basal cell carcinoma of skin of nose: Secondary | ICD-10-CM | POA: Diagnosis not present

## 2021-07-13 DIAGNOSIS — I10 Essential (primary) hypertension: Secondary | ICD-10-CM | POA: Diagnosis not present

## 2021-07-13 DIAGNOSIS — E785 Hyperlipidemia, unspecified: Secondary | ICD-10-CM | POA: Diagnosis not present

## 2021-07-13 DIAGNOSIS — Z510 Encounter for antineoplastic radiation therapy: Secondary | ICD-10-CM | POA: Diagnosis not present

## 2021-07-14 ENCOUNTER — Ambulatory Visit
Admission: RE | Admit: 2021-07-14 | Discharge: 2021-07-14 | Disposition: A | Discharge status: Home / Self Care | Payer: PPO | Source: Ambulatory Visit | Attending: Internal Medicine | Admitting: Internal Medicine

## 2021-07-14 DIAGNOSIS — N2889 Other specified disorders of kidney and ureter: Secondary | ICD-10-CM | POA: Insufficient documentation

## 2021-07-14 DIAGNOSIS — K8020 Calculus of gallbladder without cholecystitis without obstruction: Secondary | ICD-10-CM | POA: Diagnosis not present

## 2021-07-14 DIAGNOSIS — N1330 Unspecified hydronephrosis: Secondary | ICD-10-CM | POA: Diagnosis not present

## 2021-07-14 DIAGNOSIS — N289 Disorder of kidney and ureter, unspecified: Secondary | ICD-10-CM | POA: Diagnosis not present

## 2021-07-14 DIAGNOSIS — K862 Cyst of pancreas: Secondary | ICD-10-CM | POA: Diagnosis not present

## 2021-07-19 ENCOUNTER — Other Ambulatory Visit: Payer: Self-pay | Admitting: Internal Medicine

## 2021-07-19 DIAGNOSIS — N2889 Other specified disorders of kidney and ureter: Secondary | ICD-10-CM

## 2021-07-22 DIAGNOSIS — N1339 Other hydronephrosis: Secondary | ICD-10-CM | POA: Diagnosis not present

## 2021-07-22 DIAGNOSIS — N390 Urinary tract infection, site not specified: Secondary | ICD-10-CM | POA: Diagnosis not present

## 2021-08-03 ENCOUNTER — Ambulatory Visit
Admission: RE | Admit: 2021-08-03 | Discharge: 2021-08-03 | Disposition: A | Discharge status: Home / Self Care | Payer: PPO | Source: Ambulatory Visit | Attending: Internal Medicine | Admitting: Internal Medicine

## 2021-08-03 DIAGNOSIS — N2889 Other specified disorders of kidney and ureter: Secondary | ICD-10-CM | POA: Insufficient documentation

## 2021-08-03 DIAGNOSIS — E1122 Type 2 diabetes mellitus with diabetic chronic kidney disease: Secondary | ICD-10-CM | POA: Diagnosis not present

## 2021-08-03 DIAGNOSIS — N1832 Chronic kidney disease, stage 3b: Secondary | ICD-10-CM | POA: Diagnosis not present

## 2021-08-03 DIAGNOSIS — K862 Cyst of pancreas: Secondary | ICD-10-CM | POA: Diagnosis not present

## 2021-08-03 DIAGNOSIS — K820 Obstruction of gallbladder: Secondary | ICD-10-CM | POA: Diagnosis not present

## 2021-08-03 MED ORDER — GADOBUTROL 1 MMOL/ML IV SOLN
10.0000 mL | Freq: Once | INTRAVENOUS | Status: AC | PRN
  Administered 2021-08-03: 10 mL via INTRAVENOUS

## 2021-08-11 ENCOUNTER — Encounter: Payer: Self-pay | Admitting: Urology

## 2021-08-11 ENCOUNTER — Other Ambulatory Visit: Payer: Self-pay

## 2021-08-11 ENCOUNTER — Ambulatory Visit: Payer: PPO | Admitting: Urology

## 2021-08-11 VITALS — BP 157/75 | HR 106 | Ht 72.0 in | Wt 215.0 lb

## 2021-08-11 DIAGNOSIS — N2889 Other specified disorders of kidney and ureter: Secondary | ICD-10-CM | POA: Diagnosis not present

## 2021-08-11 DIAGNOSIS — R8271 Bacteriuria: Secondary | ICD-10-CM | POA: Diagnosis not present

## 2021-08-11 NOTE — Progress Notes (Signed)
? ?08/11/2021 ?2:14 PM  ? ?Dakota Parker ?[birth year] ?[id number] ? ?Referring provider: Franciso Bend, MD ?[place] ?[address] ?[place],  [place] [address] ? ?Chief Complaint  ?Patient presents with  ? Nephrolithiasis  ? ? ?HPI: ?Dakota Parker is a 88 y.o. male presents for evaluation of left renal masses. ? ?Followed by Dr. Smith Mince for stage 3 CKD and an RUS performed January 2022 showed a prominent cortical bulge in the left interpolar kidney without discrete mass.  He elected not to have an MRI at that time ?Found to have asymptomatic bacteriuria January 2023.  He was initially treated with Cipro and a follow-up UA showed persistent pyuria and he was started on Augmentin.  He has no flank, abdominal or pelvic pain.  No bothersome LUTS or dysuria.  Urine culture grew mixed flora ?MRI was subsequently ordered to evaluate the pyuria initially noncontrast and subsequently with and without contrast due to an incomplete evaluation ?Renal mass protocol MRI performed 08/03/2021 showed a 2.4 x 2.4 x 2.3 cm left renal cystic mass with mural nodularity and internal enhancement indicative of a cystic neoplasm.  There was a similar-appearing lesion in the interpolar portion of the left kidney measuring 1.9 x 1.9 x 1.7 cm ? ? ?PMH: ?Past Medical History:  ?Diagnosis Date  ? Complication of anesthesia   ? Hypertension   ? Kidney stones   ? ? ?Surgical History: ?Past Surgical History:  ?Procedure Laterality Date  ? KNEE ARTHROSCOPY Left   ? KNEE ARTHROSCOPY Left 01/04/2015  ? Procedure: Left knee arthroscopy parital medial menisectomy, chondraplasty,;  Surgeon: Dereck Leep, MD;  Location: [facility] ORS;  Service: Orthopedics;  Laterality: Left;  ? ? ?Home Medications:  ?Allergies as of 08/11/2021   ?No Known Allergies ?  ? ?  ?Medication List  ?  ? ?  ? Accurate as of August 11, 2021  2:14 PM. If you have any questions, ask your nurse or doctor.  ?  ?  ? ?  ? ?amLODipine 10 MG tablet ?Commonly known as: NORVASC ?Take 5 mg by mouth 2  (two) times daily. ?  ?aspirin 81 MG tablet ?Take 81 mg by mouth 2 (two) times a week. Mon and Fri ?  ?Fluzone High-Dose Quadrivalent 0.7 ML Susy ?Generic drug: Influenza Vac High-Dose Quad ?  ?hydrochlorothiazide 25 MG tablet ?Commonly known as: HYDRODIURIL ?Take 25 mg by mouth daily as needed. ?  ?HYDROcodone-acetaminophen 5-325 MG tablet ?Commonly known as: Norco ?Take 1-2 tablets by mouth every 4 (four) hours as needed for moderate pain. ?  ?lisinopril 40 MG tablet ?Commonly known as: ZESTRIL ?Take 40 mg by mouth daily. ?  ? ?  ? ? ?Allergies: No Known Allergies ? ?Family History: ?No family history on file. ? ?Social History:  reports that he quit smoking about 53 years ago. His smoking use included cigarettes. He does not have any smokeless tobacco history on file. He reports that he does not drink alcohol and does not use drugs. ? ? ?Physical Exam: ?BP (!) 157/75   Pulse (!) 106   Ht 6' (1.829 m)   Wt 215 lb (97.5 kg)   BMI 29.16 kg/m?   ?Constitutional:  Alert and oriented, No acute distress. ?HEENT: [place] AT, moist mucus membranes.  Trachea midline, no masses. ?Cardiovascular: No clubbing, cyanosis, or edema. ?Respiratory: Normal respiratory effort, no increased work of breathing. ?Psychiatric: Normal mood and affect. ? ? ?Assessment & Plan:   ? ?Left renal masses ?Bosniak 3/4 cystic renal  mass x2 measuring <3 cm ?We discussed based on MRI characteristics the masses are suspicious for malignancy ?We discussed this in detail and in regards to the spectrum of renal masses which includes cysts (pure cysts are considered benign), solid masses and everything in between. The risk of metastasis increases as the size of solid renal mass increases. In general, it is believed that the risk of metastasis for renal masses less than 3-4 cm is small (up to approximately 5%) based mainly on large retrospective studies. ?In some cases and especially in patients of older age and multiple comorbidities a surveillance  approach may be appropriate. The treatment of renal masses includes: surveillance, percutaneous cryoablation in selected cases in addition to partial and complete nephrectomy (each with option of laparoscopic, robotic and open depending on appropriateness). Furthermore, nephrectomy appears to be an independent risk factor for the development of chronic kidney disease suggesting that nephron sparing approaches should be implored whenever feasible. We reviewed these options in context of the patients current situation as well as the pros and cons of each. ?Based on age and chronic kidney disease may have initially elected surveillance ?Will schedule follow-up renal mass protocol MRI in 6 months ? ?2.  Asymptomatic bacteriuria ?We discussed that treatment of asymptomatic bacteriuria is not recommended due to the likelihood of bacterial resistance ? ? ? ?Abbie Sons, MD ? ?[place] ?[address], [address] ?[place], [place] [address] ?[phone] ? ?

## 2021-08-14 ENCOUNTER — Encounter: Payer: Self-pay | Admitting: Urology

## 2021-08-17 DIAGNOSIS — H401133 Primary open-angle glaucoma, bilateral, severe stage: Secondary | ICD-10-CM | POA: Diagnosis not present

## 2021-08-19 ENCOUNTER — Ambulatory Visit: Payer: Self-pay | Admitting: Urology

## 2021-08-19 DIAGNOSIS — H401133 Primary open-angle glaucoma, bilateral, severe stage: Secondary | ICD-10-CM | POA: Diagnosis not present

## 2021-08-26 DIAGNOSIS — I10 Essential (primary) hypertension: Secondary | ICD-10-CM | POA: Diagnosis not present

## 2021-08-26 DIAGNOSIS — E1122 Type 2 diabetes mellitus with diabetic chronic kidney disease: Secondary | ICD-10-CM | POA: Diagnosis not present

## 2021-08-26 DIAGNOSIS — N1832 Chronic kidney disease, stage 3b: Secondary | ICD-10-CM | POA: Diagnosis not present

## 2021-09-06 DIAGNOSIS — N184 Chronic kidney disease, stage 4 (severe): Secondary | ICD-10-CM | POA: Diagnosis not present

## 2021-09-06 DIAGNOSIS — E1122 Type 2 diabetes mellitus with diabetic chronic kidney disease: Secondary | ICD-10-CM | POA: Diagnosis not present

## 2021-09-06 DIAGNOSIS — I10 Essential (primary) hypertension: Secondary | ICD-10-CM | POA: Diagnosis not present

## 2021-09-06 DIAGNOSIS — N1832 Chronic kidney disease, stage 3b: Secondary | ICD-10-CM | POA: Diagnosis not present

## 2021-09-06 DIAGNOSIS — N2889 Other specified disorders of kidney and ureter: Secondary | ICD-10-CM | POA: Diagnosis not present

## 2021-09-09 DIAGNOSIS — E1122 Type 2 diabetes mellitus with diabetic chronic kidney disease: Secondary | ICD-10-CM | POA: Diagnosis not present

## 2021-09-09 DIAGNOSIS — N1832 Chronic kidney disease, stage 3b: Secondary | ICD-10-CM | POA: Diagnosis not present

## 2021-09-14 DIAGNOSIS — L821 Other seborrheic keratosis: Secondary | ICD-10-CM | POA: Diagnosis not present

## 2021-09-14 DIAGNOSIS — X32XXXA Exposure to sunlight, initial encounter: Secondary | ICD-10-CM | POA: Diagnosis not present

## 2021-09-14 DIAGNOSIS — I872 Venous insufficiency (chronic) (peripheral): Secondary | ICD-10-CM | POA: Diagnosis not present

## 2021-09-14 DIAGNOSIS — Z08 Encounter for follow-up examination after completed treatment for malignant neoplasm: Secondary | ICD-10-CM | POA: Diagnosis not present

## 2021-09-14 DIAGNOSIS — C44311 Basal cell carcinoma of skin of nose: Secondary | ICD-10-CM | POA: Diagnosis not present

## 2021-09-14 DIAGNOSIS — D485 Neoplasm of uncertain behavior of skin: Secondary | ICD-10-CM | POA: Diagnosis not present

## 2021-09-14 DIAGNOSIS — Z85828 Personal history of other malignant neoplasm of skin: Secondary | ICD-10-CM | POA: Diagnosis not present

## 2021-09-14 DIAGNOSIS — L814 Other melanin hyperpigmentation: Secondary | ICD-10-CM | POA: Diagnosis not present

## 2021-09-14 DIAGNOSIS — B353 Tinea pedis: Secondary | ICD-10-CM | POA: Diagnosis not present

## 2021-09-14 DIAGNOSIS — L820 Inflamed seborrheic keratosis: Secondary | ICD-10-CM | POA: Diagnosis not present

## 2021-09-14 DIAGNOSIS — L570 Actinic keratosis: Secondary | ICD-10-CM | POA: Diagnosis not present

## 2021-09-26 DIAGNOSIS — H401113 Primary open-angle glaucoma, right eye, severe stage: Secondary | ICD-10-CM | POA: Diagnosis not present

## 2021-09-28 DIAGNOSIS — C44310 Basal cell carcinoma of skin of unspecified parts of face: Secondary | ICD-10-CM | POA: Diagnosis not present

## 2021-09-28 DIAGNOSIS — Z4802 Encounter for removal of sutures: Secondary | ICD-10-CM | POA: Diagnosis not present

## 2021-09-28 DIAGNOSIS — C44311 Basal cell carcinoma of skin of nose: Secondary | ICD-10-CM | POA: Diagnosis not present

## 2021-09-30 DIAGNOSIS — E1122 Type 2 diabetes mellitus with diabetic chronic kidney disease: Secondary | ICD-10-CM | POA: Diagnosis not present

## 2021-09-30 DIAGNOSIS — N1832 Chronic kidney disease, stage 3b: Secondary | ICD-10-CM | POA: Diagnosis not present

## 2021-10-11 DIAGNOSIS — C44311 Basal cell carcinoma of skin of nose: Secondary | ICD-10-CM | POA: Diagnosis not present

## 2021-10-19 DIAGNOSIS — E1122 Type 2 diabetes mellitus with diabetic chronic kidney disease: Secondary | ICD-10-CM | POA: Diagnosis not present

## 2021-10-19 DIAGNOSIS — Z79899 Other long term (current) drug therapy: Secondary | ICD-10-CM | POA: Diagnosis not present

## 2021-10-19 DIAGNOSIS — Z87891 Personal history of nicotine dependence: Secondary | ICD-10-CM | POA: Diagnosis not present

## 2021-10-19 DIAGNOSIS — N189 Chronic kidney disease, unspecified: Secondary | ICD-10-CM | POA: Diagnosis not present

## 2021-10-19 DIAGNOSIS — E785 Hyperlipidemia, unspecified: Secondary | ICD-10-CM | POA: Diagnosis not present

## 2021-10-19 DIAGNOSIS — Z823 Family history of stroke: Secondary | ICD-10-CM | POA: Diagnosis not present

## 2021-10-19 DIAGNOSIS — C44311 Basal cell carcinoma of skin of nose: Secondary | ICD-10-CM | POA: Diagnosis not present

## 2021-10-19 DIAGNOSIS — I129 Hypertensive chronic kidney disease with stage 1 through stage 4 chronic kidney disease, or unspecified chronic kidney disease: Secondary | ICD-10-CM | POA: Diagnosis not present

## 2021-10-28 DIAGNOSIS — C44311 Basal cell carcinoma of skin of nose: Secondary | ICD-10-CM | POA: Diagnosis not present

## 2021-11-02 DIAGNOSIS — L988 Other specified disorders of the skin and subcutaneous tissue: Secondary | ICD-10-CM | POA: Diagnosis not present

## 2021-11-02 DIAGNOSIS — C44311 Basal cell carcinoma of skin of nose: Secondary | ICD-10-CM | POA: Diagnosis not present

## 2021-11-18 DIAGNOSIS — H401113 Primary open-angle glaucoma, right eye, severe stage: Secondary | ICD-10-CM | POA: Diagnosis not present

## 2021-11-28 DIAGNOSIS — C44311 Basal cell carcinoma of skin of nose: Secondary | ICD-10-CM | POA: Diagnosis not present

## 2021-11-29 DIAGNOSIS — C44311 Basal cell carcinoma of skin of nose: Secondary | ICD-10-CM | POA: Diagnosis not present

## 2021-11-29 DIAGNOSIS — L988 Other specified disorders of the skin and subcutaneous tissue: Secondary | ICD-10-CM | POA: Diagnosis not present

## 2021-11-29 DIAGNOSIS — I781 Nevus, non-neoplastic: Secondary | ICD-10-CM | POA: Diagnosis not present

## 2021-11-29 DIAGNOSIS — Z85828 Personal history of other malignant neoplasm of skin: Secondary | ICD-10-CM | POA: Diagnosis not present

## 2021-11-29 DIAGNOSIS — Z923 Personal history of irradiation: Secondary | ICD-10-CM | POA: Diagnosis not present

## 2021-11-29 DIAGNOSIS — L578 Other skin changes due to chronic exposure to nonionizing radiation: Secondary | ICD-10-CM | POA: Diagnosis not present

## 2021-12-06 DIAGNOSIS — D485 Neoplasm of uncertain behavior of skin: Secondary | ICD-10-CM | POA: Diagnosis not present

## 2021-12-27 DIAGNOSIS — I5032 Chronic diastolic (congestive) heart failure: Secondary | ICD-10-CM | POA: Diagnosis not present

## 2021-12-27 DIAGNOSIS — N1832 Chronic kidney disease, stage 3b: Secondary | ICD-10-CM | POA: Diagnosis not present

## 2021-12-27 DIAGNOSIS — Z79899 Other long term (current) drug therapy: Secondary | ICD-10-CM | POA: Diagnosis not present

## 2021-12-27 DIAGNOSIS — E119 Type 2 diabetes mellitus without complications: Secondary | ICD-10-CM | POA: Diagnosis not present

## 2022-01-18 DIAGNOSIS — I5032 Chronic diastolic (congestive) heart failure: Secondary | ICD-10-CM | POA: Diagnosis not present

## 2022-01-18 DIAGNOSIS — N184 Chronic kidney disease, stage 4 (severe): Secondary | ICD-10-CM | POA: Diagnosis not present

## 2022-01-18 DIAGNOSIS — N2889 Other specified disorders of kidney and ureter: Secondary | ICD-10-CM | POA: Diagnosis not present

## 2022-01-18 DIAGNOSIS — I10 Essential (primary) hypertension: Secondary | ICD-10-CM | POA: Diagnosis not present

## 2022-01-23 DIAGNOSIS — N2889 Other specified disorders of kidney and ureter: Secondary | ICD-10-CM | POA: Diagnosis not present

## 2022-01-23 DIAGNOSIS — N184 Chronic kidney disease, stage 4 (severe): Secondary | ICD-10-CM | POA: Diagnosis not present

## 2022-01-23 DIAGNOSIS — R600 Localized edema: Secondary | ICD-10-CM | POA: Diagnosis not present

## 2022-01-23 DIAGNOSIS — I10 Essential (primary) hypertension: Secondary | ICD-10-CM | POA: Diagnosis not present

## 2022-01-23 DIAGNOSIS — E875 Hyperkalemia: Secondary | ICD-10-CM | POA: Diagnosis not present

## 2022-01-26 DIAGNOSIS — N184 Chronic kidney disease, stage 4 (severe): Secondary | ICD-10-CM | POA: Diagnosis not present

## 2022-01-26 DIAGNOSIS — I129 Hypertensive chronic kidney disease with stage 1 through stage 4 chronic kidney disease, or unspecified chronic kidney disease: Secondary | ICD-10-CM | POA: Diagnosis not present

## 2022-01-26 DIAGNOSIS — E1122 Type 2 diabetes mellitus with diabetic chronic kidney disease: Secondary | ICD-10-CM | POA: Diagnosis not present

## 2022-01-30 ENCOUNTER — Ambulatory Visit
Admission: RE | Admit: 2022-01-30 | Discharge: 2022-01-30 | Disposition: A | Discharge status: Home / Self Care | Payer: PPO | Source: Ambulatory Visit | Attending: Urology | Admitting: Urology

## 2022-01-30 DIAGNOSIS — K8020 Calculus of gallbladder without cholecystitis without obstruction: Secondary | ICD-10-CM | POA: Diagnosis not present

## 2022-01-30 DIAGNOSIS — N2889 Other specified disorders of kidney and ureter: Secondary | ICD-10-CM | POA: Diagnosis not present

## 2022-01-30 DIAGNOSIS — I700 Atherosclerosis of aorta: Secondary | ICD-10-CM | POA: Diagnosis not present

## 2022-01-30 DIAGNOSIS — K862 Cyst of pancreas: Secondary | ICD-10-CM | POA: Diagnosis not present

## 2022-01-30 MED ORDER — GADOBUTROL 1 MMOL/ML IV SOLN
9.0000 mL | Freq: Once | INTRAVENOUS | Status: AC | PRN
  Administered 2022-01-30: 9 mL via INTRAVENOUS

## 2022-02-15 ENCOUNTER — Ambulatory Visit: Payer: PPO | Admitting: Urology

## 2022-02-15 ENCOUNTER — Encounter: Payer: Self-pay | Admitting: Urology

## 2022-02-15 VITALS — BP 181/63 | HR 91 | Ht 72.0 in | Wt 208.0 lb

## 2022-02-15 DIAGNOSIS — N2889 Other specified disorders of kidney and ureter: Secondary | ICD-10-CM

## 2022-02-15 NOTE — Progress Notes (Signed)
   02/15/2022 10:35 AM   Dakota Parker 1930/06/30 [id number]  Referring provider: Arlis Porta, [place],  [place] [address]  Chief Complaint  Patient presents with   Follow-up    HPI: 88 y.o. male presents for follow-up of left renal masses.  He presents today with his daughter.  Initially seen 08/11/2021 for enhancing renal masses.  After discussing options he elected active surveillance 6 month follow-up MRI performed 01/30/2022 showed stable upper and interpolar left renal masses without significant interval growth Stable pancreatic head cystic lesion and 2-year follow-up imaging recommended Since his last visit he has been treated for basal cell carcinoma of the nose and underwent radiation and Mohs surgery for a recurrence Also diagnosed with significant glaucoma   PMH: Past Medical History:  Diagnosis Date   Complication of anesthesia    Hypertension    Kidney stones     Surgical History: Past Surgical History:  Procedure Laterality Date   KNEE ARTHROSCOPY Left    KNEE ARTHROSCOPY Left 01/04/2015   Procedure: Left knee arthroscopy parital medial menisectomy, chondraplasty,;  Surgeon: Dereck Leep, MD;  Location: [facility] ORS;  Service: Orthopedics;  Laterality: Left;    Home Medications:  Allergies as of 02/15/2022   No Known Allergies      Medication List        Accurate as of February 15, 2022 10:35 AM. If you have any questions, ask your nurse or doctor.          amLODipine 10 MG tablet Commonly known as: NORVASC Take 5 mg by mouth 2 (two) times daily.   aspirin 81 MG tablet Take 81 mg by mouth 2 (two) times a week. Mon and Fri   Fluzone High-Dose Quadrivalent 0.7 ML Susy Generic drug: Influenza Vac High-Dose Quad   hydrochlorothiazide 25 MG tablet Commonly known as: HYDRODIURIL Take 25 mg by mouth daily as needed.   HYDROcodone-acetaminophen 5-325 MG tablet Commonly known as: Norco Take 1-2 tablets by mouth every 4  (four) hours as needed for moderate pain.   latanoprost 0.005 % ophthalmic solution Commonly known as: XALATAN 1 drop daily.   lisinopril 40 MG tablet Commonly known as: ZESTRIL Take 40 mg by mouth daily.        Allergies: No Known Allergies  Family History: History reviewed. No pertinent family history.  Social History:  reports that he quit smoking about 54 years ago. His smoking use included cigarettes. He does not have any smokeless tobacco history on file. He reports that he does not drink alcohol and does not use drugs.   Physical Exam: BP (!) 181/63   Pulse 91   Ht 6' (1.829 m)   Wt 208 lb (94.3 kg)   BMI 28.21 kg/m   Constitutional:  Alert and oriented, No acute distress. HEENT: [place] AT, moist mucus membranes.  Trachea midline, no masses. Cardiovascular: No clubbing, cyanosis, or edema. Respiratory: Normal respiratory effort, no increased work of breathing. Psychiatric: Normal mood and affect.   Assessment & Plan:    Left renal masses Stable left renal masses.  They are enhancing and most likely represent renal cell carcinoma Based on size, stability and patient age continued surveillance is a reasonable option which she desires to continue Schedule repeat MRI 9 months with follow-up visit    Abbie Sons, [place] [address] [place], [place] [address] [phone]

## 2022-03-21 DIAGNOSIS — H02054 Trichiasis without entropian left upper eyelid: Secondary | ICD-10-CM | POA: Diagnosis not present

## 2022-05-12 DIAGNOSIS — N1832 Chronic kidney disease, stage 3b: Secondary | ICD-10-CM | POA: Diagnosis not present

## 2022-05-16 DIAGNOSIS — N184 Chronic kidney disease, stage 4 (severe): Secondary | ICD-10-CM | POA: Diagnosis not present

## 2022-05-16 DIAGNOSIS — N2889 Other specified disorders of kidney and ureter: Secondary | ICD-10-CM | POA: Diagnosis not present

## 2022-05-16 DIAGNOSIS — I10 Essential (primary) hypertension: Secondary | ICD-10-CM | POA: Diagnosis not present

## 2022-05-16 DIAGNOSIS — E875 Hyperkalemia: Secondary | ICD-10-CM | POA: Diagnosis not present

## 2022-07-06 DIAGNOSIS — E875 Hyperkalemia: Secondary | ICD-10-CM | POA: Diagnosis not present

## 2022-07-06 DIAGNOSIS — Z0000 Encounter for general adult medical examination without abnormal findings: Secondary | ICD-10-CM | POA: Diagnosis not present

## 2022-07-06 DIAGNOSIS — E1122 Type 2 diabetes mellitus with diabetic chronic kidney disease: Secondary | ICD-10-CM | POA: Diagnosis not present

## 2022-07-06 DIAGNOSIS — N184 Chronic kidney disease, stage 4 (severe): Secondary | ICD-10-CM | POA: Diagnosis not present

## 2022-07-06 DIAGNOSIS — E538 Deficiency of other specified B group vitamins: Secondary | ICD-10-CM | POA: Diagnosis not present

## 2022-07-06 DIAGNOSIS — Z23 Encounter for immunization: Secondary | ICD-10-CM | POA: Diagnosis not present

## 2022-08-08 DIAGNOSIS — L821 Other seborrheic keratosis: Secondary | ICD-10-CM | POA: Diagnosis not present

## 2022-08-08 DIAGNOSIS — L814 Other melanin hyperpigmentation: Secondary | ICD-10-CM | POA: Diagnosis not present

## 2022-08-08 DIAGNOSIS — D225 Melanocytic nevi of trunk: Secondary | ICD-10-CM | POA: Diagnosis not present

## 2022-08-08 DIAGNOSIS — Z08 Encounter for follow-up examination after completed treatment for malignant neoplasm: Secondary | ICD-10-CM | POA: Diagnosis not present

## 2022-08-08 DIAGNOSIS — C44319 Basal cell carcinoma of skin of other parts of face: Secondary | ICD-10-CM | POA: Diagnosis not present

## 2022-08-08 DIAGNOSIS — Z85828 Personal history of other malignant neoplasm of skin: Secondary | ICD-10-CM | POA: Diagnosis not present

## 2022-08-08 DIAGNOSIS — Z7189 Other specified counseling: Secondary | ICD-10-CM | POA: Diagnosis not present

## 2022-08-08 DIAGNOSIS — C441122 Basal cell carcinoma of skin of right lower eyelid, including canthus: Secondary | ICD-10-CM | POA: Diagnosis not present

## 2022-08-08 DIAGNOSIS — L02821 Furuncle of head [any part, except face]: Secondary | ICD-10-CM | POA: Diagnosis not present

## 2022-08-08 DIAGNOSIS — D485 Neoplasm of uncertain behavior of skin: Secondary | ICD-10-CM | POA: Diagnosis not present

## 2022-09-19 DIAGNOSIS — H401113 Primary open-angle glaucoma, right eye, severe stage: Secondary | ICD-10-CM | POA: Diagnosis not present

## 2022-09-19 DIAGNOSIS — H2513 Age-related nuclear cataract, bilateral: Secondary | ICD-10-CM | POA: Diagnosis not present

## 2022-09-19 DIAGNOSIS — H401122 Primary open-angle glaucoma, left eye, moderate stage: Secondary | ICD-10-CM | POA: Diagnosis not present

## 2022-09-19 DIAGNOSIS — C441122 Basal cell carcinoma of skin of right lower eyelid, including canthus: Secondary | ICD-10-CM | POA: Diagnosis not present

## 2022-09-29 DIAGNOSIS — I129 Hypertensive chronic kidney disease with stage 1 through stage 4 chronic kidney disease, or unspecified chronic kidney disease: Secondary | ICD-10-CM | POA: Diagnosis not present

## 2022-09-29 DIAGNOSIS — E785 Hyperlipidemia, unspecified: Secondary | ICD-10-CM | POA: Diagnosis not present

## 2022-09-29 DIAGNOSIS — M1712 Unilateral primary osteoarthritis, left knee: Secondary | ICD-10-CM | POA: Diagnosis not present

## 2022-09-29 DIAGNOSIS — Z79899 Other long term (current) drug therapy: Secondary | ICD-10-CM | POA: Diagnosis not present

## 2022-09-29 DIAGNOSIS — E1122 Type 2 diabetes mellitus with diabetic chronic kidney disease: Secondary | ICD-10-CM | POA: Diagnosis not present

## 2022-09-29 DIAGNOSIS — N184 Chronic kidney disease, stage 4 (severe): Secondary | ICD-10-CM | POA: Diagnosis not present

## 2022-09-29 DIAGNOSIS — Z87442 Personal history of urinary calculi: Secondary | ICD-10-CM | POA: Diagnosis not present

## 2022-09-29 DIAGNOSIS — G510 Bell's palsy: Secondary | ICD-10-CM | POA: Diagnosis not present

## 2022-10-03 DIAGNOSIS — G510 Bell's palsy: Secondary | ICD-10-CM | POA: Diagnosis not present

## 2022-10-03 DIAGNOSIS — N1832 Chronic kidney disease, stage 3b: Secondary | ICD-10-CM | POA: Diagnosis not present

## 2022-10-10 DIAGNOSIS — C44310 Basal cell carcinoma of skin of unspecified parts of face: Secondary | ICD-10-CM | POA: Diagnosis not present

## 2022-10-10 DIAGNOSIS — C441122 Basal cell carcinoma of skin of right lower eyelid, including canthus: Secondary | ICD-10-CM | POA: Diagnosis not present

## 2022-10-24 DIAGNOSIS — N184 Chronic kidney disease, stage 4 (severe): Secondary | ICD-10-CM | POA: Diagnosis not present

## 2022-10-24 DIAGNOSIS — I10 Essential (primary) hypertension: Secondary | ICD-10-CM | POA: Diagnosis not present

## 2022-10-31 DIAGNOSIS — E875 Hyperkalemia: Secondary | ICD-10-CM | POA: Diagnosis not present

## 2022-10-31 DIAGNOSIS — N1832 Chronic kidney disease, stage 3b: Secondary | ICD-10-CM | POA: Diagnosis not present

## 2022-10-31 DIAGNOSIS — E1122 Type 2 diabetes mellitus with diabetic chronic kidney disease: Secondary | ICD-10-CM | POA: Diagnosis not present

## 2022-10-31 DIAGNOSIS — N2889 Other specified disorders of kidney and ureter: Secondary | ICD-10-CM | POA: Diagnosis not present

## 2022-10-31 DIAGNOSIS — I10 Essential (primary) hypertension: Secondary | ICD-10-CM | POA: Diagnosis not present

## 2022-10-31 DIAGNOSIS — N184 Chronic kidney disease, stage 4 (severe): Secondary | ICD-10-CM | POA: Diagnosis not present

## 2022-11-02 DIAGNOSIS — C44319 Basal cell carcinoma of skin of other parts of face: Secondary | ICD-10-CM | POA: Diagnosis not present

## 2022-11-02 DIAGNOSIS — C441122 Basal cell carcinoma of skin of right lower eyelid, including canthus: Secondary | ICD-10-CM | POA: Diagnosis not present

## 2022-11-03 DIAGNOSIS — Z87891 Personal history of nicotine dependence: Secondary | ICD-10-CM | POA: Diagnosis not present

## 2022-11-03 DIAGNOSIS — Z48817 Encounter for surgical aftercare following surgery on the skin and subcutaneous tissue: Secondary | ICD-10-CM | POA: Diagnosis not present

## 2022-11-03 DIAGNOSIS — C441122 Basal cell carcinoma of skin of right lower eyelid, including canthus: Secondary | ICD-10-CM | POA: Diagnosis not present

## 2022-11-03 DIAGNOSIS — I129 Hypertensive chronic kidney disease with stage 1 through stage 4 chronic kidney disease, or unspecified chronic kidney disease: Secondary | ICD-10-CM | POA: Diagnosis not present

## 2022-11-03 DIAGNOSIS — C44310 Basal cell carcinoma of skin of unspecified parts of face: Secondary | ICD-10-CM | POA: Diagnosis not present

## 2022-11-03 DIAGNOSIS — Z79899 Other long term (current) drug therapy: Secondary | ICD-10-CM | POA: Diagnosis not present

## 2022-11-03 DIAGNOSIS — E1122 Type 2 diabetes mellitus with diabetic chronic kidney disease: Secondary | ICD-10-CM | POA: Diagnosis not present

## 2022-11-03 DIAGNOSIS — N1832 Chronic kidney disease, stage 3b: Secondary | ICD-10-CM | POA: Diagnosis not present

## 2022-11-03 DIAGNOSIS — Z808 Family history of malignant neoplasm of other organs or systems: Secondary | ICD-10-CM | POA: Diagnosis not present

## 2022-11-03 DIAGNOSIS — Z823 Family history of stroke: Secondary | ICD-10-CM | POA: Diagnosis not present

## 2022-11-03 DIAGNOSIS — E785 Hyperlipidemia, unspecified: Secondary | ICD-10-CM | POA: Diagnosis not present

## 2022-11-14 ENCOUNTER — Ambulatory Visit
Admission: RE | Admit: 2022-11-14 | Discharge: 2022-11-14 | Disposition: A | Discharge status: Home / Self Care | Payer: PPO | Source: Ambulatory Visit | Attending: Urology | Admitting: Urology

## 2022-11-14 DIAGNOSIS — K8020 Calculus of gallbladder without cholecystitis without obstruction: Secondary | ICD-10-CM | POA: Diagnosis not present

## 2022-11-14 DIAGNOSIS — N2889 Other specified disorders of kidney and ureter: Secondary | ICD-10-CM | POA: Diagnosis not present

## 2022-11-14 MED ORDER — GADOBUTROL 1 MMOL/ML IV SOLN
10.0000 mL | Freq: Once | INTRAVENOUS | Status: AC | PRN
  Administered 2022-11-14: 10 mL via INTRAVENOUS

## 2022-11-16 ENCOUNTER — Ambulatory Visit: Payer: PPO | Admitting: Urology

## 2022-11-16 ENCOUNTER — Encounter: Payer: Self-pay | Admitting: Urology

## 2022-11-16 VITALS — BP 109/67 | HR 114 | Ht 71.0 in | Wt 209.0 lb

## 2022-11-16 DIAGNOSIS — N2889 Other specified disorders of kidney and ureter: Secondary | ICD-10-CM

## 2022-11-16 NOTE — Progress Notes (Signed)
02/15/2022 10:41 AM   Dakota Parker 11-Aug-1930 [id number]  Referring provider: Veneda Melter, FNP [address] [place],  [place] [address]  Chief Complaint  Patient presents with   Other    Left  renal mass    HPI: 89 y.o. male presents for follow-up of left renal masses.  He presents today with his daughter.  Initially seen 08/11/2021 for enhancing renal masses.  After discussing options he elected active surveillance 6 month follow-up MRI performed 01/30/2022 showed stable upper and interpolar left renal masses without significant interval growth Since his last visit, his daughter states he has had 2 Mohs surgeries for skin cancer He has had lower blood pressure than usual and has been taken off his antihypertensive medication MRI performed on 11/14/2022 has not yet been read  PMH: Past Medical History:  Diagnosis Date   Complication of anesthesia    Hypertension    Kidney stones     Surgical History: Past Surgical History:  Procedure Laterality Date   KNEE ARTHROSCOPY Left    KNEE ARTHROSCOPY Left 01/04/2015   Procedure: Left knee arthroscopy parital medial menisectomy, chondraplasty,;  Surgeon: Donato Heinz, MD;  Location: [facility] ORS;  Service: Orthopedics;  Laterality: Left;    Home Medications:  Allergies as of 11/16/2022   No Known Allergies      Medication List        Accurate as of November 16, 2022 10:41 AM. If you have any questions, ask your nurse or doctor.          amLODipine 10 MG tablet Commonly known as: NORVASC Take 5 mg by mouth 2 (two) times daily.   aspirin 81 MG tablet Take 81 mg by mouth 2 (two) times a week. Mon and Fri   Fluzone High-Dose Quadrivalent 0.7 ML Susy Generic drug: Influenza Vac High-Dose Quad   hydrochlorothiazide 25 MG tablet Commonly known as: HYDRODIURIL Take 25 mg by mouth daily as needed.   HYDROcodone-acetaminophen 5-325 MG tablet Commonly known as: Norco Take 1-2 tablets by mouth every 4 (four) hours  as needed for moderate pain.   latanoprost 0.005 % ophthalmic solution Commonly known as: XALATAN 1 drop daily.   lisinopril 40 MG tablet Commonly known as: ZESTRIL Take 40 mg by mouth daily.        Social History:  reports that he quit smoking about 54 years ago. His smoking use included cigarettes. He does not have any smokeless tobacco history on file. He reports that he does not drink alcohol and does not use drugs.   Physical Exam: BP 109/67   Pulse (!) 114   Ht 5\' 11"  (1.803 m)   Wt 209 lb (94.8 kg)   BMI 29.15 kg/m   Constitutional:  Alert and oriented, No acute distress. HEENT: [place] AT, moist mucus membranes.  Trachea midline, no masses. Cardiovascular: No clubbing, cyanosis, or edema. Respiratory: Normal respiratory effort, no increased work of breathing. Psychiatric: Normal mood and affect.  Pertinent Imaging:  MRI was personally reviewed and interpreted. The left renal masses have increased slightly in size but not significant on my review.   Assessment & Plan:    Left renal masses Stable on my review Continue surveillance and we will move to annual MRI If radiology interpretation differs, the patient will be contacted. He was wondering if he could be dehydrated as a cause of his low blood pressure and urinalysis was ordered to check specific gravity  I have reviewed the above documentation for accuracy  and completeness, and I agree with the above.   Riki Altes, MD  [facility] [address] [place], [place] [address] [phone]

## 2022-11-17 ENCOUNTER — Telehealth: Payer: Self-pay

## 2022-11-17 LAB — MICROSCOPIC EXAMINATION

## 2022-11-17 LAB — URINALYSIS, COMPLETE
Bilirubin, UA: NEGATIVE
Glucose, UA: NEGATIVE
Ketones, UA: NEGATIVE
Nitrite, UA: POSITIVE — AB
Specific Gravity, UA: 1.02 (ref 1.005–1.030)
Urobilinogen, Ur: 0.2 mg/dL (ref 0.2–1.0)
pH, UA: 5 (ref 5.0–7.5)

## 2022-11-17 NOTE — Telephone Encounter (Signed)
Darl Pikes advised and Nurse visit scheduled for 11/17/22.

## 2022-11-17 NOTE — Telephone Encounter (Signed)
Darl Pikes, patient's daughter, left a message on triage line stating patient was seen yesterday and gave UA sample originally to check for specific gravity but she noticed on mychart there were several things showing up abnormal and wanted to know what it means and if patient should be aware of anything further to do at this time. She wanted explanation on the each abnormal item that came up on UA

## 2022-11-17 NOTE — Telephone Encounter (Signed)
On review of his previous urinalysis he has chronic WBCs in the urine.  Most likely asymptomatic bacteriuria which does not need to be treated.  Would recommend a nurse visit for repeat UA, culture and PVR

## 2022-11-20 ENCOUNTER — Encounter: Payer: Self-pay | Admitting: Physician Assistant

## 2022-11-20 ENCOUNTER — Ambulatory Visit (INDEPENDENT_AMBULATORY_CARE_PROVIDER_SITE_OTHER): Payer: PPO | Admitting: Physician Assistant

## 2022-11-20 VITALS — BP 151/92 | HR 105 | Ht 71.0 in | Wt 212.0 lb

## 2022-11-20 DIAGNOSIS — R8281 Pyuria: Secondary | ICD-10-CM

## 2022-11-20 DIAGNOSIS — R8271 Bacteriuria: Secondary | ICD-10-CM

## 2022-11-20 DIAGNOSIS — N2889 Other specified disorders of kidney and ureter: Secondary | ICD-10-CM | POA: Diagnosis not present

## 2022-11-20 LAB — MICROSCOPIC EXAMINATION: WBC, UA: >30 /hpf — AB (ref 0–5)

## 2022-11-20 LAB — URINALYSIS, COMPLETE
Bilirubin, UA: NEGATIVE
Ketones, UA: NEGATIVE
Nitrite, UA: POSITIVE — AB
Specific Gravity, UA: 1.015 (ref 1.005–1.030)
Urobilinogen, Ur: 0.2 mg/dL (ref 0.2–1.0)
pH, UA: 5.5 (ref 5.0–7.5)

## 2022-11-20 LAB — BLADDER SCAN AMB NON-IMAGING

## 2022-11-20 NOTE — Progress Notes (Signed)
11/20/2022 1:49 PM   Lynnae January Odonnell Apr 23, 1931 [id number]  CC: Chief Complaint  Patient presents with   Follow-up   Recurrent UTI   HPI: Dakota Parker is a 89 y.o. male with PMH stable left renal masses on surveillance who presents today for repeat UA after being found to have asymptomatic pyuria with Dr. Lonna Cobb last week.  He is accompanied today by his daughter, Darl Pikes, who contributes to HPI.  Today he reports no dysuria, lower abdominal pain, low back pain, gross hematuria, fever, chills, nausea, or vomiting.  He is not circumcised, but his foreskin is mobile and he is able to retract it without difficulty.  He resides at home by himself.  Darl Pikes is concerned that his blood pressure has been declining over the past month.  They were instructed to stop HCTZ late last week and his BP on arrival today is 151/92.  He reported extreme fatigue associated with hypotension that has now resolved off the HCTZ.  Darl Pikes notes that he is "perkier" that he has been in quite some time.  Notably, when he was first seen by Dr. Lonna Cobb in March 2023, he was noted to have a recent history of asymptomatic bacteriuria.  In-office UA today positive for trace glucose, trace lysed blood, 1+ protein, nitrites, and 2+ leukocytes; urine microscopy with >30 WBCs/HPF, 3-10 RBCs/HPF, and moderate bacteria. PVR 42mL.  PMH: Past Medical History:  Diagnosis Date   Complication of anesthesia    Hypertension    Kidney stones     Surgical History: Past Surgical History:  Procedure Laterality Date   KNEE ARTHROSCOPY Left    KNEE ARTHROSCOPY Left 01/04/2015   Procedure: Left knee arthroscopy parital medial menisectomy, chondraplasty,;  Surgeon: Donato Heinz, MD;  Location: [facility] ORS;  Service: Orthopedics;  Laterality: Left;    Home Medications:  Allergies as of 11/20/2022   No Known Allergies      Medication List        Accurate as of November 20, 2022  1:49 PM. If you have any questions, ask your  nurse or doctor.          STOP taking these medications    amLODipine 10 MG tablet Commonly known as: NORVASC Stopped by: Carman Ching, PA-C   Fluzone High-Dose Quadrivalent 0.7 ML Susy Generic drug: Influenza Vac High-Dose Quad Stopped by: Carman Ching, PA-C   hydrochlorothiazide 25 MG tablet Commonly known as: HYDRODIURIL Stopped by: Carman Ching, PA-C   HYDROcodone-acetaminophen 5-325 MG tablet Commonly known as: Norco Stopped by: Carman Ching, PA-C       TAKE these medications    aspirin 81 MG tablet Take 81 mg by mouth 2 (two) times a week. Mon and Fri   erythromycin ophthalmic ointment SMARTSIG:In Eye(s) 7 Times Daily   latanoprost 0.005 % ophthalmic solution Commonly known as: XALATAN 1 drop daily.   lisinopril 40 MG tablet Commonly known as: ZESTRIL Take 40 mg by mouth daily.        Allergies:  No Known Allergies  Family History: No family history on file.  Social History:   reports that he quit smoking about 54 years ago. His smoking use included cigarettes. He has been exposed to tobacco smoke. He has never used smokeless tobacco. He reports that he does not drink alcohol and does not use drugs.  Physical Exam: BP (!) 151/92   Pulse (!) 105   Ht 5\' 11"  (1.803 m)   Wt 212 lb (96.2 kg)   BMI 29.57  kg/m   Constitutional:  Alert and oriented, no acute distress, nontoxic appearing HEENT: , AT Cardiovascular: No clubbing, cyanosis, or edema Respiratory: Normal respiratory effort, no increased work of breathing Skin: No rashes, bruises or suspicious lesions Neurologic: Grossly intact, no focal deficits, moving all 4 extremities Psychiatric: Normal mood and affect  Laboratory Data: Results for orders placed or performed in visit on 11/20/22  Microscopic Examination   Urine  Result Value Ref Range   WBC, UA >30 (A) 0 - 5 /hpf   RBC, Urine 3-10 (A) 0 - 2 /hpf   Epithelial Cells (non renal) 0-10 0 - 10  /hpf   Mucus, UA Present (A) Not Estab.   Bacteria, UA Moderate (A) None seen/Few  Urinalysis, Complete  Result Value Ref Range   Specific Gravity, UA 1.015 1.005 - 1.030   pH, UA 5.5 5.0 - 7.5   Color, UA Yellow Yellow   Appearance Ur Hazy (A) Clear   Leukocytes,UA 2+ (A) Negative   Protein,UA 1+ (A) Negative/Trace   Glucose, UA Trace (A) Negative   Ketones, UA Negative Negative   RBC, UA Trace (A) Negative   Bilirubin, UA Negative Negative   Urobilinogen, Ur 0.2 0.2 - 1.0 mg/dL   Nitrite, UA Positive (A) Negative   Microscopic Examination See below:   BLADDER SCAN AMB NON-IMAGING  Result Value Ref Range   Scan Result 42ml    Assessment & Plan:   1. Pyuria UA today with increased pyuria and bacteriuria over prior, though he remains asymptomatic.  He is emptying appropriately.  Will send for culture but defer antibiotics at this time.  We had a lengthy conversation about asymptomatic bacteriuria and I do think he likely meets criteria for this.  If he remains asymptomatic in the setting of a positive urine culture, would recommend deferring antibiotic therapy. - Urinalysis, Complete - CULTURE, URINE COMPREHENSIVE - BLADDER SCAN AMB NON-IMAGING   Return for will call with results.  Carman Ching, PA-C  [facility] [place] [address] [place], [place] [address] [phone]

## 2022-11-23 LAB — CULTURE, URINE COMPREHENSIVE

## 2022-12-20 DIAGNOSIS — C441122 Basal cell carcinoma of skin of right lower eyelid, including canthus: Secondary | ICD-10-CM | POA: Diagnosis not present

## 2023-01-04 DIAGNOSIS — E119 Type 2 diabetes mellitus without complications: Secondary | ICD-10-CM | POA: Diagnosis not present

## 2023-01-09 DIAGNOSIS — N2889 Other specified disorders of kidney and ureter: Secondary | ICD-10-CM | POA: Diagnosis not present

## 2023-01-09 DIAGNOSIS — Z79899 Other long term (current) drug therapy: Secondary | ICD-10-CM | POA: Diagnosis not present

## 2023-01-09 DIAGNOSIS — M1712 Unilateral primary osteoarthritis, left knee: Secondary | ICD-10-CM | POA: Diagnosis not present

## 2023-01-09 DIAGNOSIS — N184 Chronic kidney disease, stage 4 (severe): Secondary | ICD-10-CM | POA: Diagnosis not present

## 2023-01-09 DIAGNOSIS — R739 Hyperglycemia, unspecified: Secondary | ICD-10-CM | POA: Diagnosis not present

## 2023-01-19 DIAGNOSIS — H401122 Primary open-angle glaucoma, left eye, moderate stage: Secondary | ICD-10-CM | POA: Diagnosis not present

## 2023-01-26 DIAGNOSIS — H401113 Primary open-angle glaucoma, right eye, severe stage: Secondary | ICD-10-CM | POA: Diagnosis not present

## 2023-01-26 DIAGNOSIS — H2513 Age-related nuclear cataract, bilateral: Secondary | ICD-10-CM | POA: Diagnosis not present

## 2023-01-26 DIAGNOSIS — H401122 Primary open-angle glaucoma, left eye, moderate stage: Secondary | ICD-10-CM | POA: Diagnosis not present

## 2023-02-06 DIAGNOSIS — H401113 Primary open-angle glaucoma, right eye, severe stage: Secondary | ICD-10-CM | POA: Diagnosis not present

## 2023-02-20 DIAGNOSIS — H401113 Primary open-angle glaucoma, right eye, severe stage: Secondary | ICD-10-CM | POA: Diagnosis not present

## 2023-02-20 DIAGNOSIS — H401122 Primary open-angle glaucoma, left eye, moderate stage: Secondary | ICD-10-CM | POA: Diagnosis not present

## 2023-03-06 DIAGNOSIS — H401122 Primary open-angle glaucoma, left eye, moderate stage: Secondary | ICD-10-CM | POA: Diagnosis not present

## 2023-03-06 DIAGNOSIS — H401113 Primary open-angle glaucoma, right eye, severe stage: Secondary | ICD-10-CM | POA: Diagnosis not present

## 2023-03-06 DIAGNOSIS — H2513 Age-related nuclear cataract, bilateral: Secondary | ICD-10-CM | POA: Diagnosis not present

## 2023-03-06 DIAGNOSIS — H04123 Dry eye syndrome of bilateral lacrimal glands: Secondary | ICD-10-CM | POA: Diagnosis not present

## 2023-03-08 DIAGNOSIS — Z85828 Personal history of other malignant neoplasm of skin: Secondary | ICD-10-CM | POA: Diagnosis not present

## 2023-03-08 DIAGNOSIS — X32XXXA Exposure to sunlight, initial encounter: Secondary | ICD-10-CM | POA: Diagnosis not present

## 2023-03-08 DIAGNOSIS — D225 Melanocytic nevi of trunk: Secondary | ICD-10-CM | POA: Diagnosis not present

## 2023-03-08 DIAGNOSIS — L570 Actinic keratosis: Secondary | ICD-10-CM | POA: Diagnosis not present

## 2023-03-08 DIAGNOSIS — Z7189 Other specified counseling: Secondary | ICD-10-CM | POA: Diagnosis not present

## 2023-03-08 DIAGNOSIS — Z08 Encounter for follow-up examination after completed treatment for malignant neoplasm: Secondary | ICD-10-CM | POA: Diagnosis not present

## 2023-03-08 DIAGNOSIS — Z09 Encounter for follow-up examination after completed treatment for conditions other than malignant neoplasm: Secondary | ICD-10-CM | POA: Diagnosis not present

## 2023-03-08 DIAGNOSIS — L814 Other melanin hyperpigmentation: Secondary | ICD-10-CM | POA: Diagnosis not present

## 2023-03-08 DIAGNOSIS — L821 Other seborrheic keratosis: Secondary | ICD-10-CM | POA: Diagnosis not present

## 2023-04-06 IMAGING — MR MR ABDOMEN WO/W CM
18 series · 46 of 48 positions shown · IV contrast (gadavist)
Comparison: Abdominal MRI 07/14/2021.

CLINICAL DATA: 89-year-old male with history of indeterminate renal
and pancreatic lesions on prior examination. Follow-up study for
definitive characterization.

EXAM:
MRI ABDOMEN WITHOUT AND WITH CONTRAST
TECHNIQUE: Multiplanar multisequence MR imaging of the abdomen was performed
both before and after the administration of intravenous contrast.
CONTRAST:  10mL GADAVIST GADOBUTROL 1 MMOL/ML IV SOLN

[Series 3: T2 · coronal · 6.0mm · 1.25mm/px · 2 of 39 slices shown (1 of 2)]
[im 1/39]
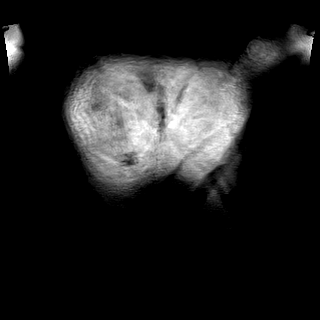
[im 39/39]
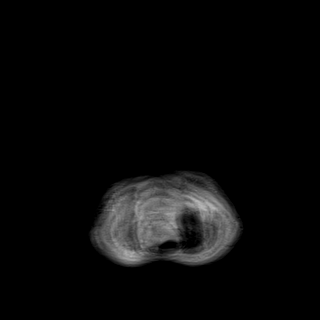

[Series 4: T2 · axial · 6.0mm · 1.25mm/px · z∈[-43,+223]mm · 2 of 38 slices shown (2 of 2)]
[im 1/38]
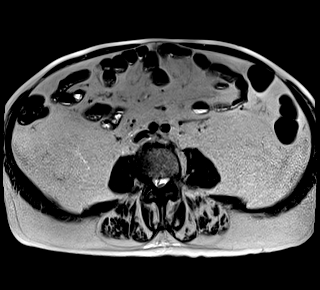
[im 38/38]
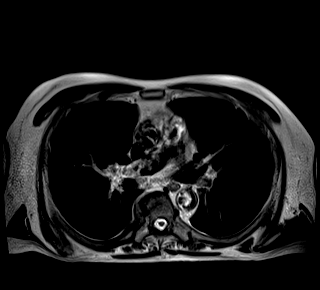

[Series 5: bSSFP · axial · 6.0mm · 0.78mm/px · z∈[-43,+223]mm · 2 of 38 slices shown]
[im 1/38]
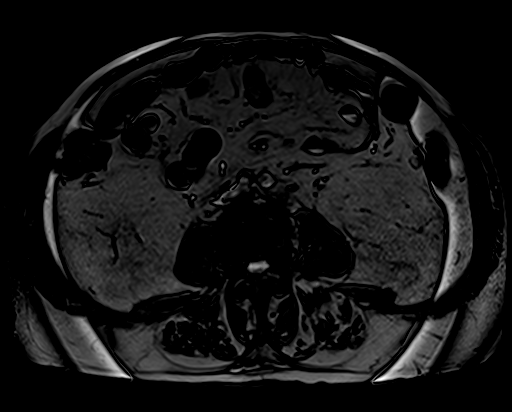
[im 38/38]
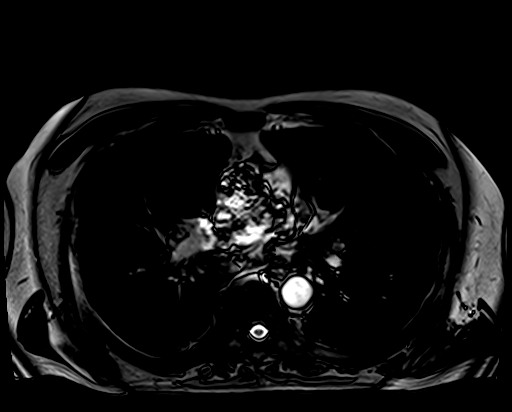

[Series 6: in & out · axial · 3.0mm · 1.25mm/px · z∈[-41,+220]mm · 3 of 88 slices shown (1 of 2)]
[im 1/88]
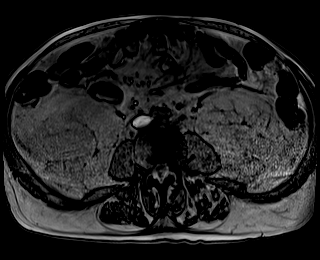
[im 44/88]
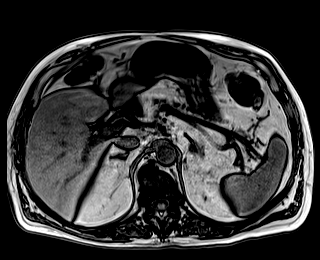
[im 88/88]
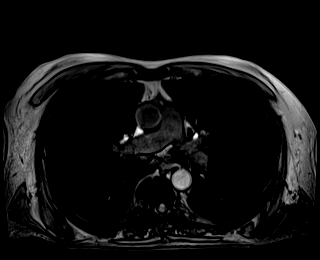

[Series 7: in & out · axial · 3.0mm · 1.25mm/px · z∈[-41,+220]mm · 3 of 88 slices shown (2 of 2)]
[im 1/88]
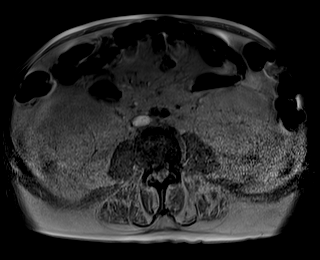
[im 44/88]
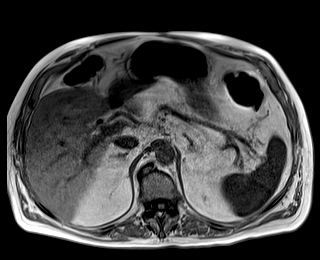
[im 88/88]
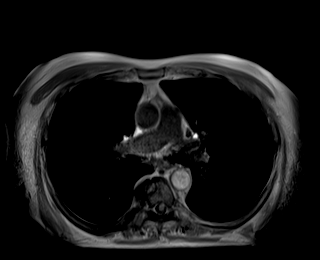

[Series 9: T2 fat-sat · axial · 6.0mm · 1.25mm/px · 1 of 40 slices shown]
[im 1/40]
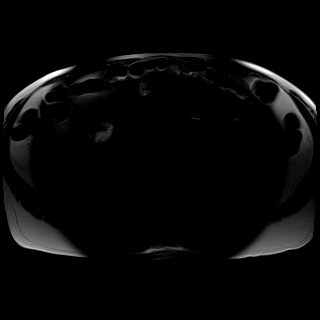

[Series 10: ax dwi_tracew · axial · 6.0mm · 1.49mm/px · z∈[-42,+239]mm · 4 of 120 slices shown]
[im 1/120]
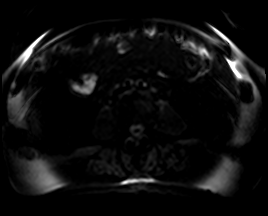
[im 40/120]
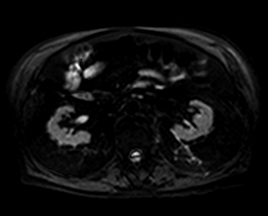
[im 80/120]
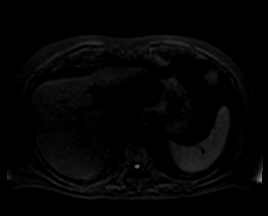
[im 120/120]
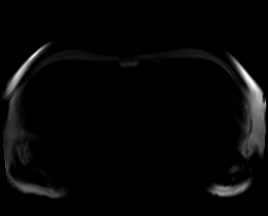

[Series 11: ax dwi_adc · axial · 6.0mm · 1.49mm/px · 1 of 40 slices shown]
[im 1/40]
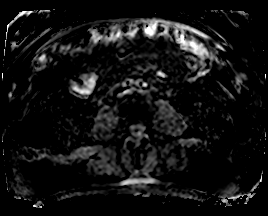

[Series 13: T1 dynamic fat-sat · axial · 3.0mm · 1.25mm/px · z∈[-41,+220]mm · 3 of 88 slices shown (1 of 9)]
[im 1/88]
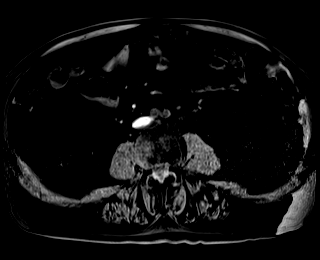
[im 44/88]
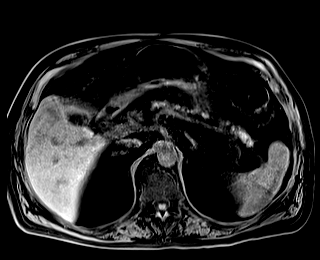
[im 88/88]
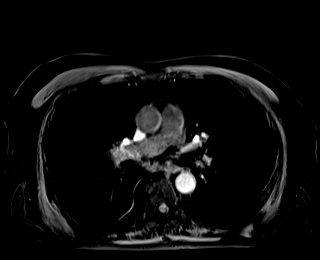

[Series 16: T1 dynamic fat-sat · axial · 3.0mm · 1.25mm/px · z∈[-41,+220]mm · 3 of 88 slices shown (2 of 9)]
[im 1/88]
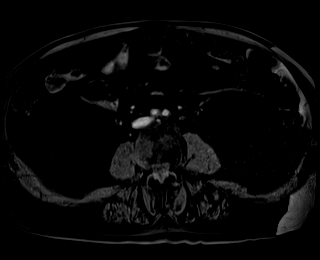
[im 44/88]
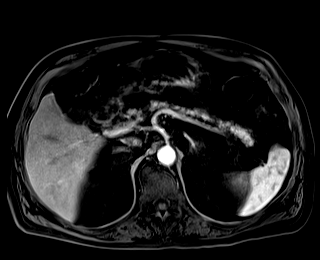
[im 88/88]
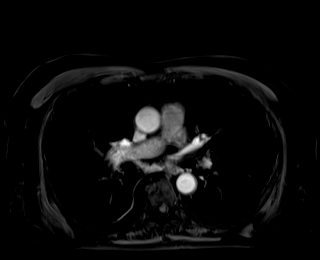

[Series 17: T1 dynamic fat-sat · axial · 3.0mm · 1.25mm/px · z∈[-41,+220]mm · 3 of 88 slices shown (3 of 9)]
[im 1/88]
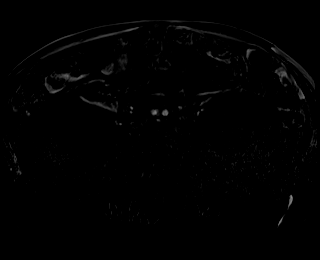
[im 44/88]
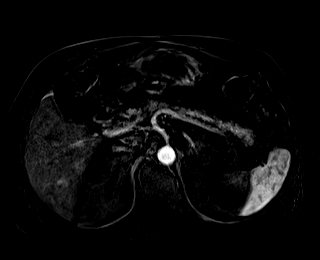
[im 88/88]
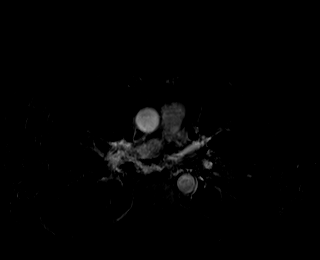

[Series 20: T1 dynamic fat-sat · axial · 3.0mm · 1.25mm/px · z∈[-41,+220]mm · 3 of 88 slices shown (4 of 9)]
[im 1/88]
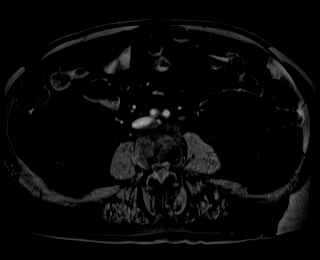
[im 44/88]
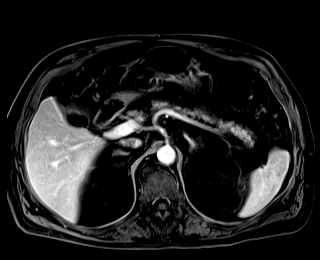
[im 88/88]
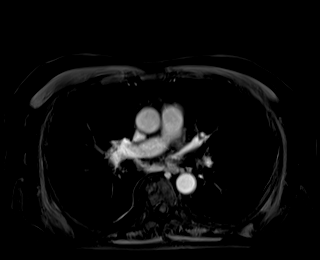

[Series 21: T1 dynamic fat-sat · axial · 3.0mm · 1.25mm/px · z∈[-41,+220]mm · 3 of 88 slices shown (5 of 9)]
[im 1/88]
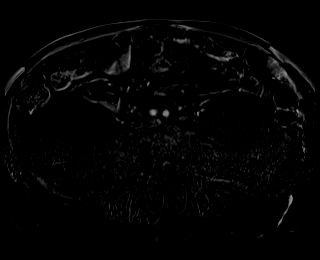
[im 44/88]
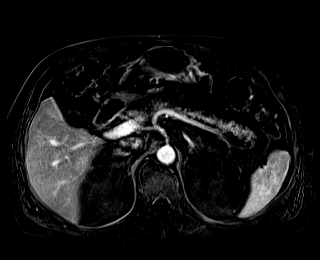
[im 88/88]
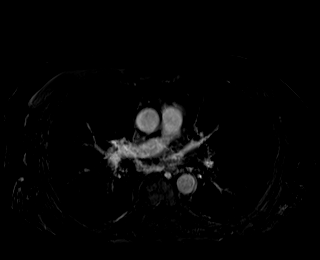

[Series 24: T1 dynamic fat-sat · axial · 3.0mm · 1.25mm/px · z∈[-41,+220]mm · 3 of 88 slices shown (6 of 9)]
[im 1/88]
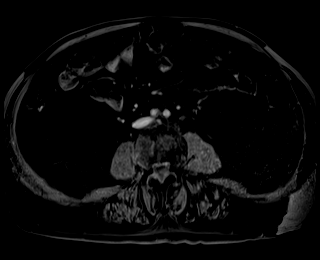
[im 44/88]
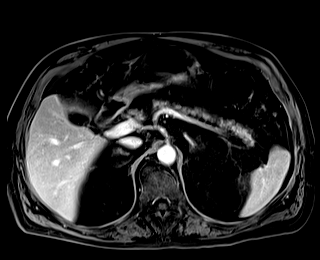
[im 88/88]
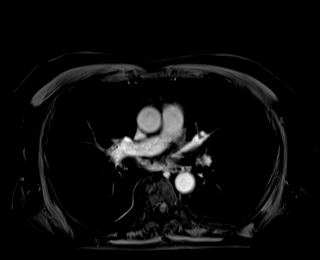

[Series 25: T1 dynamic fat-sat · axial · 3.0mm · 1.25mm/px · z∈[-41,+220]mm · 3 of 88 slices shown (7 of 9)]
[im 1/88]
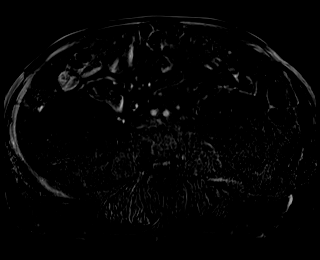
[im 44/88]
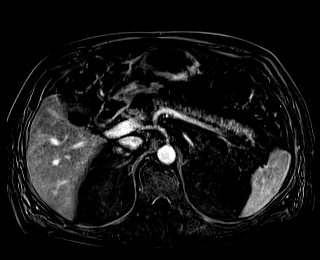
[im 88/88]
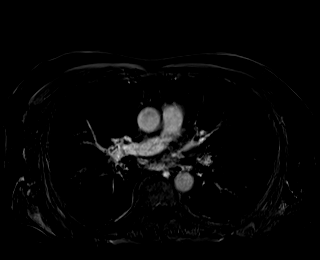

[Series 26: T1 dynamic post-contrast · coronal · 3.0mm · 1.31mm/px · 3 of 80 slices shown]
[im 1/80]
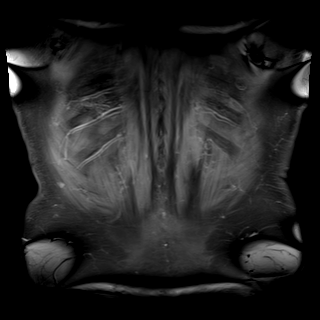
[im 40/80]
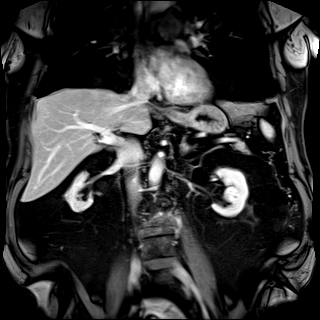
[im 80/80]
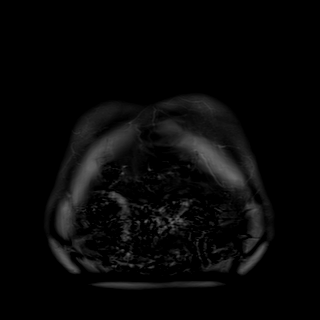

[Series 29: T1 dynamic fat-sat · axial · 3.0mm · 1.25mm/px · z∈[-41,+220]mm · 3 of 88 slices shown (8 of 9)]
[im 1/88]
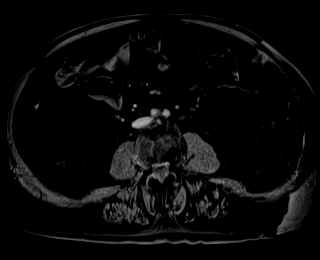
[im 44/88]
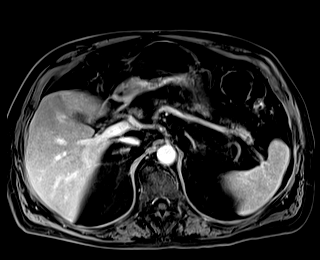
[im 88/88]
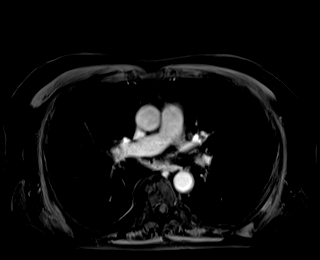

[Series 30: T1 dynamic fat-sat · axial · 3.0mm · 1.25mm/px · 1 of 88 slices shown (9 of 9)]
[im 1/88]
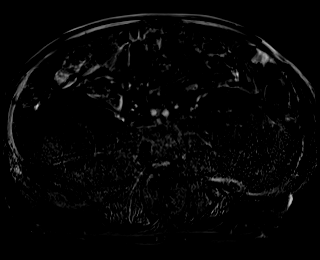

[46 of 48 positions shown; findings below may reference images not displayed]

FINDINGS: Lower chest: Unremarkable.

Hepatobiliary: No suspicious cystic or solid hepatic lesions. No
intra or extrahepatic biliary ductal dilatation. Gallbladder is
completely contracted around multiple filling defects, compatible
with innumerable tiny gallstones. Gallbladder wall thickness is
normal. No pericholecystic fluid or surrounding inflammatory
changes. Common bile duct measures 4 mm in the porta hepatis, and
there are no filling defects within the common bile duct to suggest
the presence of choledocholithiasis.

Pancreas: In the head of the pancreas (axial image 22 of series 4
and coronal image 23 of series 3) there is a 1.1 x 0.7 x 0.8 cm T1
hypointense, T2 hyperintense, nonenhancing lesion. No other
pancreatic mass. No pancreatic ductal dilatation. No peripancreatic
fluid collections or inflammatory changes.

Spleen:  Unremarkable.

Adrenals/Urinary Tract: In the upper pole of the left kidney (axial
image 22 of series 4 and coronal image 16 of series 3) there is an
exophytic 2.4 x 2.4 x 2.3 cm lesion which is mildly hypointense on
T1 weighted images, heterogeneously hyperintense on T2 weighted
images with multiple internal septations and extensive mural
nodularity and areas of internal enhancement on post gadolinium
images, indicative of a mixed cystic and solid renal neoplasm,
likely a small renal cell carcinoma. A similar appearing lesion is
also noted in the interpolar region of the left kidney (axial image
28 of series 4 and coronal image 18 of series 3) measuring 1.9 x
x 1.7 cm, also likely a renal cell carcinoma. Other subcentimeter T1
hypointense, T2 hyperintense, nonenhancing lesions are noted in both
kidneys, compatible with tiny simple cysts. No hydroureteronephrosis
in the visualized portions of the abdomen. Bilateral adrenal glands
are normal in appearance.

Stomach/Bowel: Normal appearance of the stomach. No pathologic
dilatation of visualized portions of small bowel or colon.

Vascular/Lymphatic: No aneurysm identified in the visualized
abdominal vasculature. No lymphadenopathy noted in the abdomen.

Other: No significant volume of ascites noted in the visualized
portions of the peritoneal cavity.

Musculoskeletal: No aggressive appearing osseous lesions are noted
in the visualized portions of the skeleton.
IMPRESSION: 1. There are 2 aggressive appearing left renal lesions, likely to
represent small renal cell carcinomas, as detailed above. These
appear encapsulated within Gerota's fascia and are well separated
from the left renal vein which is widely patent at this time. No
definite lymphadenopathy or signs of metastatic disease in the
abdomen.
2. Small cystic lesion in the head of the pancreas, favored to
represent a small pancreatic pseudocyst or possible side branch IPMN
(intraductal papillary mucinous neoplasm). Repeat abdominal MRI with
and without IV gadolinium with MRCP or pancreatic protocol CT scan
is recommended in 2 years to ensure the stability of this finding.
This recommendation follows ACR consensus guidelines: Management of
Incidental Pancreatic Cysts: A White Paper of the ACR Incidental
Findings Committee. [HOSPITAL] 8175;[DATE].

These results will be called to the ordering clinician or
representative by the Radiologist Assistant, and communication
documented in the PACS or [REDACTED].

## 2023-07-03 DIAGNOSIS — H401122 Primary open-angle glaucoma, left eye, moderate stage: Secondary | ICD-10-CM | POA: Diagnosis not present

## 2023-07-05 DIAGNOSIS — I10 Essential (primary) hypertension: Secondary | ICD-10-CM | POA: Diagnosis not present

## 2023-07-05 DIAGNOSIS — E1122 Type 2 diabetes mellitus with diabetic chronic kidney disease: Secondary | ICD-10-CM | POA: Diagnosis not present

## 2023-07-05 DIAGNOSIS — E875 Hyperkalemia: Secondary | ICD-10-CM | POA: Diagnosis not present

## 2023-07-05 DIAGNOSIS — N2889 Other specified disorders of kidney and ureter: Secondary | ICD-10-CM | POA: Diagnosis not present

## 2023-07-05 DIAGNOSIS — N1832 Chronic kidney disease, stage 3b: Secondary | ICD-10-CM | POA: Diagnosis not present

## 2023-07-05 DIAGNOSIS — N184 Chronic kidney disease, stage 4 (severe): Secondary | ICD-10-CM | POA: Diagnosis not present

## 2023-07-09 DIAGNOSIS — I10 Essential (primary) hypertension: Secondary | ICD-10-CM | POA: Diagnosis not present

## 2023-07-09 DIAGNOSIS — N1832 Chronic kidney disease, stage 3b: Secondary | ICD-10-CM | POA: Diagnosis not present

## 2023-07-09 DIAGNOSIS — N2889 Other specified disorders of kidney and ureter: Secondary | ICD-10-CM | POA: Diagnosis not present

## 2023-07-09 DIAGNOSIS — R739 Hyperglycemia, unspecified: Secondary | ICD-10-CM | POA: Diagnosis not present

## 2023-07-09 DIAGNOSIS — E875 Hyperkalemia: Secondary | ICD-10-CM | POA: Diagnosis not present

## 2023-07-09 DIAGNOSIS — E1122 Type 2 diabetes mellitus with diabetic chronic kidney disease: Secondary | ICD-10-CM | POA: Diagnosis not present

## 2023-07-09 DIAGNOSIS — Z79899 Other long term (current) drug therapy: Secondary | ICD-10-CM | POA: Diagnosis not present

## 2023-07-10 DIAGNOSIS — H2513 Age-related nuclear cataract, bilateral: Secondary | ICD-10-CM | POA: Diagnosis not present

## 2023-07-10 DIAGNOSIS — H04123 Dry eye syndrome of bilateral lacrimal glands: Secondary | ICD-10-CM | POA: Diagnosis not present

## 2023-07-10 DIAGNOSIS — H401122 Primary open-angle glaucoma, left eye, moderate stage: Secondary | ICD-10-CM | POA: Diagnosis not present

## 2023-07-10 DIAGNOSIS — H02054 Trichiasis without entropian left upper eyelid: Secondary | ICD-10-CM | POA: Diagnosis not present

## 2023-07-10 DIAGNOSIS — H401113 Primary open-angle glaucoma, right eye, severe stage: Secondary | ICD-10-CM | POA: Diagnosis not present

## 2023-07-16 DIAGNOSIS — J849 Interstitial pulmonary disease, unspecified: Secondary | ICD-10-CM | POA: Diagnosis not present

## 2023-07-16 DIAGNOSIS — N184 Chronic kidney disease, stage 4 (severe): Secondary | ICD-10-CM | POA: Diagnosis not present

## 2023-07-16 DIAGNOSIS — Z0000 Encounter for general adult medical examination without abnormal findings: Secondary | ICD-10-CM | POA: Diagnosis not present

## 2023-07-16 DIAGNOSIS — E1122 Type 2 diabetes mellitus with diabetic chronic kidney disease: Secondary | ICD-10-CM | POA: Insufficient documentation

## 2023-07-16 DIAGNOSIS — N1832 Chronic kidney disease, stage 3b: Secondary | ICD-10-CM | POA: Diagnosis not present

## 2023-07-16 DIAGNOSIS — E119 Type 2 diabetes mellitus without complications: Secondary | ICD-10-CM | POA: Diagnosis not present

## 2023-07-16 DIAGNOSIS — E538 Deficiency of other specified B group vitamins: Secondary | ICD-10-CM | POA: Diagnosis not present

## 2023-07-16 DIAGNOSIS — N3090 Cystitis, unspecified without hematuria: Secondary | ICD-10-CM | POA: Diagnosis not present

## 2023-09-09 DIAGNOSIS — N2889 Other specified disorders of kidney and ureter: Secondary | ICD-10-CM | POA: Diagnosis not present

## 2023-09-09 DIAGNOSIS — R0609 Other forms of dyspnea: Secondary | ICD-10-CM | POA: Diagnosis not present

## 2023-09-09 DIAGNOSIS — E119 Type 2 diabetes mellitus without complications: Secondary | ICD-10-CM | POA: Diagnosis not present

## 2023-09-09 DIAGNOSIS — N1831 Chronic kidney disease, stage 3a: Secondary | ICD-10-CM | POA: Diagnosis not present

## 2023-09-09 DIAGNOSIS — Z87891 Personal history of nicotine dependence: Secondary | ICD-10-CM | POA: Diagnosis not present

## 2023-09-09 DIAGNOSIS — I2692 Saddle embolus of pulmonary artery without acute cor pulmonale: Secondary | ICD-10-CM | POA: Diagnosis not present

## 2023-09-09 DIAGNOSIS — I129 Hypertensive chronic kidney disease with stage 1 through stage 4 chronic kidney disease, or unspecified chronic kidney disease: Secondary | ICD-10-CM | POA: Diagnosis not present

## 2023-09-09 DIAGNOSIS — I82411 Acute embolism and thrombosis of right femoral vein: Secondary | ICD-10-CM | POA: Diagnosis not present

## 2023-09-09 DIAGNOSIS — R918 Other nonspecific abnormal finding of lung field: Secondary | ICD-10-CM | POA: Diagnosis not present

## 2023-09-09 DIAGNOSIS — I213 ST elevation (STEMI) myocardial infarction of unspecified site: Secondary | ICD-10-CM | POA: Diagnosis not present

## 2023-09-09 DIAGNOSIS — I82441 Acute embolism and thrombosis of right tibial vein: Secondary | ICD-10-CM | POA: Diagnosis not present

## 2023-09-09 DIAGNOSIS — I2602 Saddle embolus of pulmonary artery with acute cor pulmonale: Secondary | ICD-10-CM | POA: Diagnosis not present

## 2023-09-09 DIAGNOSIS — R079 Chest pain, unspecified: Secondary | ICD-10-CM | POA: Diagnosis not present

## 2023-09-09 DIAGNOSIS — J9601 Acute respiratory failure with hypoxia: Secondary | ICD-10-CM | POA: Diagnosis not present

## 2023-09-09 DIAGNOSIS — R7989 Other specified abnormal findings of blood chemistry: Secondary | ICD-10-CM | POA: Diagnosis not present

## 2023-09-09 DIAGNOSIS — N1832 Chronic kidney disease, stage 3b: Secondary | ICD-10-CM | POA: Diagnosis not present

## 2023-09-09 DIAGNOSIS — N390 Urinary tract infection, site not specified: Secondary | ICD-10-CM | POA: Diagnosis not present

## 2023-09-09 DIAGNOSIS — I10 Essential (primary) hypertension: Secondary | ICD-10-CM | POA: Diagnosis not present

## 2023-09-10 DIAGNOSIS — N1831 Chronic kidney disease, stage 3a: Secondary | ICD-10-CM | POA: Diagnosis not present

## 2023-09-10 DIAGNOSIS — I2609 Other pulmonary embolism with acute cor pulmonale: Secondary | ICD-10-CM | POA: Insufficient documentation

## 2023-09-10 DIAGNOSIS — E785 Hyperlipidemia, unspecified: Secondary | ICD-10-CM | POA: Diagnosis not present

## 2023-09-10 DIAGNOSIS — I2602 Saddle embolus of pulmonary artery with acute cor pulmonale: Secondary | ICD-10-CM | POA: Diagnosis not present

## 2023-09-10 DIAGNOSIS — I129 Hypertensive chronic kidney disease with stage 1 through stage 4 chronic kidney disease, or unspecified chronic kidney disease: Secondary | ICD-10-CM | POA: Diagnosis not present

## 2023-09-10 DIAGNOSIS — Z1152 Encounter for screening for COVID-19: Secondary | ICD-10-CM | POA: Diagnosis not present

## 2023-09-10 DIAGNOSIS — N184 Chronic kidney disease, stage 4 (severe): Secondary | ICD-10-CM | POA: Diagnosis not present

## 2023-09-10 DIAGNOSIS — Z85828 Personal history of other malignant neoplasm of skin: Secondary | ICD-10-CM | POA: Diagnosis not present

## 2023-09-10 DIAGNOSIS — E119 Type 2 diabetes mellitus without complications: Secondary | ICD-10-CM | POA: Diagnosis not present

## 2023-09-10 DIAGNOSIS — Z87891 Personal history of nicotine dependence: Secondary | ICD-10-CM | POA: Diagnosis not present

## 2023-09-10 DIAGNOSIS — N390 Urinary tract infection, site not specified: Secondary | ICD-10-CM | POA: Diagnosis not present

## 2023-09-10 DIAGNOSIS — E1122 Type 2 diabetes mellitus with diabetic chronic kidney disease: Secondary | ICD-10-CM | POA: Diagnosis not present

## 2023-09-10 DIAGNOSIS — D6859 Other primary thrombophilia: Secondary | ICD-10-CM | POA: Diagnosis not present

## 2023-09-10 DIAGNOSIS — C642 Malignant neoplasm of left kidney, except renal pelvis: Secondary | ICD-10-CM | POA: Diagnosis not present

## 2023-09-10 DIAGNOSIS — I213 ST elevation (STEMI) myocardial infarction of unspecified site: Secondary | ICD-10-CM | POA: Diagnosis not present

## 2023-09-10 DIAGNOSIS — R7989 Other specified abnormal findings of blood chemistry: Secondary | ICD-10-CM | POA: Diagnosis not present

## 2023-09-10 DIAGNOSIS — J9601 Acute respiratory failure with hypoxia: Secondary | ICD-10-CM | POA: Diagnosis not present

## 2023-09-10 DIAGNOSIS — N2889 Other specified disorders of kidney and ureter: Secondary | ICD-10-CM | POA: Diagnosis not present

## 2023-09-10 DIAGNOSIS — N1832 Chronic kidney disease, stage 3b: Secondary | ICD-10-CM | POA: Diagnosis not present

## 2023-09-10 DIAGNOSIS — Z79899 Other long term (current) drug therapy: Secondary | ICD-10-CM | POA: Diagnosis not present

## 2023-09-17 DIAGNOSIS — I10 Essential (primary) hypertension: Secondary | ICD-10-CM | POA: Diagnosis not present

## 2023-09-17 DIAGNOSIS — N1832 Chronic kidney disease, stage 3b: Secondary | ICD-10-CM | POA: Diagnosis not present

## 2023-09-18 ENCOUNTER — Encounter: Payer: Self-pay | Admitting: Urology

## 2023-09-18 DIAGNOSIS — C642 Malignant neoplasm of left kidney, except renal pelvis: Secondary | ICD-10-CM | POA: Insufficient documentation

## 2023-09-19 DIAGNOSIS — I2601 Septic pulmonary embolism with acute cor pulmonale: Secondary | ICD-10-CM | POA: Diagnosis not present

## 2023-09-19 DIAGNOSIS — C642 Malignant neoplasm of left kidney, except renal pelvis: Secondary | ICD-10-CM | POA: Diagnosis not present

## 2023-09-19 DIAGNOSIS — I2699 Other pulmonary embolism without acute cor pulmonale: Secondary | ICD-10-CM | POA: Diagnosis not present

## 2023-09-19 DIAGNOSIS — C44629 Squamous cell carcinoma of skin of left upper limb, including shoulder: Secondary | ICD-10-CM | POA: Diagnosis not present

## 2023-10-11 DIAGNOSIS — X32XXXA Exposure to sunlight, initial encounter: Secondary | ICD-10-CM | POA: Diagnosis not present

## 2023-10-11 DIAGNOSIS — Z08 Encounter for follow-up examination after completed treatment for malignant neoplasm: Secondary | ICD-10-CM | POA: Diagnosis not present

## 2023-10-11 DIAGNOSIS — D225 Melanocytic nevi of trunk: Secondary | ICD-10-CM | POA: Diagnosis not present

## 2023-10-11 DIAGNOSIS — Z7189 Other specified counseling: Secondary | ICD-10-CM | POA: Diagnosis not present

## 2023-10-11 DIAGNOSIS — L821 Other seborrheic keratosis: Secondary | ICD-10-CM | POA: Diagnosis not present

## 2023-10-11 DIAGNOSIS — L814 Other melanin hyperpigmentation: Secondary | ICD-10-CM | POA: Diagnosis not present

## 2023-10-11 DIAGNOSIS — L570 Actinic keratosis: Secondary | ICD-10-CM | POA: Diagnosis not present

## 2023-10-11 DIAGNOSIS — Z85828 Personal history of other malignant neoplasm of skin: Secondary | ICD-10-CM | POA: Diagnosis not present

## 2023-11-06 ENCOUNTER — Encounter: Payer: Self-pay | Admitting: Urology

## 2023-11-08 DIAGNOSIS — I10 Essential (primary) hypertension: Secondary | ICD-10-CM | POA: Diagnosis not present

## 2023-11-08 DIAGNOSIS — H6123 Impacted cerumen, bilateral: Secondary | ICD-10-CM | POA: Diagnosis not present

## 2023-11-13 DIAGNOSIS — H6123 Impacted cerumen, bilateral: Secondary | ICD-10-CM | POA: Diagnosis not present

## 2023-11-13 DIAGNOSIS — I10 Essential (primary) hypertension: Secondary | ICD-10-CM | POA: Diagnosis not present

## 2023-11-16 ENCOUNTER — Ambulatory Visit: Payer: Self-pay | Admitting: Urology

## 2023-12-03 DIAGNOSIS — N2889 Other specified disorders of kidney and ureter: Secondary | ICD-10-CM | POA: Diagnosis not present

## 2023-12-03 DIAGNOSIS — I2602 Saddle embolus of pulmonary artery with acute cor pulmonale: Secondary | ICD-10-CM | POA: Diagnosis not present

## 2023-12-03 DIAGNOSIS — I2692 Saddle embolus of pulmonary artery without acute cor pulmonale: Secondary | ICD-10-CM | POA: Diagnosis not present

## 2023-12-03 DIAGNOSIS — I3481 Nonrheumatic mitral (valve) annulus calcification: Secondary | ICD-10-CM | POA: Diagnosis not present

## 2023-12-17 DIAGNOSIS — H6122 Impacted cerumen, left ear: Secondary | ICD-10-CM | POA: Diagnosis not present

## 2023-12-17 DIAGNOSIS — I10 Essential (primary) hypertension: Secondary | ICD-10-CM | POA: Diagnosis not present

## 2023-12-19 DIAGNOSIS — H02054 Trichiasis without entropian left upper eyelid: Secondary | ICD-10-CM | POA: Diagnosis not present

## 2023-12-19 DIAGNOSIS — H401122 Primary open-angle glaucoma, left eye, moderate stage: Secondary | ICD-10-CM | POA: Diagnosis not present

## 2023-12-19 DIAGNOSIS — H2513 Age-related nuclear cataract, bilateral: Secondary | ICD-10-CM | POA: Diagnosis not present

## 2023-12-19 DIAGNOSIS — H401113 Primary open-angle glaucoma, right eye, severe stage: Secondary | ICD-10-CM | POA: Diagnosis not present

## 2023-12-20 DIAGNOSIS — N2889 Other specified disorders of kidney and ureter: Secondary | ICD-10-CM | POA: Diagnosis not present

## 2024-01-08 ENCOUNTER — Ambulatory Visit: Attending: Internal Medicine

## 2024-01-08 DIAGNOSIS — R269 Unspecified abnormalities of gait and mobility: Secondary | ICD-10-CM | POA: Insufficient documentation

## 2024-01-08 DIAGNOSIS — M6281 Muscle weakness (generalized): Secondary | ICD-10-CM | POA: Insufficient documentation

## 2024-01-08 NOTE — Therapy (Signed)
 OUTPATIENT PHYSICAL THERAPY NEURO EVALUATION   Patient Name: Dakota Parker MRN: [id number] DOB:13-Aug-1930, 89 y.o., male Today's Date: 01/08/2024   PCP: Cleotilde Oneil FALCON, MD REFERRING PROVIDER: Cleotilde Oneil FALCON, MD  END OF SESSION:  PT End of Session - 01/08/24 1413     Visit Number 1    Number of Visits 17    Date for PT Re-Evaluation 03/04/24    PT Start Time 1302    PT Stop Time 1350    PT Time Calculation (min) 48 min    Activity Tolerance Patient tolerated treatment well    Behavior During Therapy [facility] for tasks assessed/performed          Past Medical History:  Diagnosis Date   Complication of anesthesia    Hypertension    Kidney stones    Past Surgical History:  Procedure Laterality Date   KNEE ARTHROSCOPY Left    KNEE ARTHROSCOPY Left 01/04/2015   Procedure: Left knee arthroscopy parital medial menisectomy, chondraplasty,;  Surgeon: Lynwood SHAUNNA Hue, MD;  Location: [facility] ORS;  Service: Orthopedics;  Laterality: Left;   There are no active problems to display for this patient.   ONSET DATE: September 11, 2023  REFERRING DIAG: R53.1 (ICD-10-CM) - Weakness  THERAPY DIAG:  Muscle weakness (generalized)  Abnormality of gait and mobility  Rationale for Evaluation and Treatment: Rehabilitation  SUBJECTIVE:                                                                                                                                                                                             SUBJECTIVE STATEMENT: Pt is a pleasant 89 y.o. male referred to OPPT for weakness. 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 Reports symptoms ongoing for many months before (summer of 2024) going to ED and then hospital admission. Now not so much SOB, but having weakness. Roughly a 10 minute tolerance for physical activity before needing to rest. Denies falls. Knees don't buckle, but reports need to have a seat. Has goals to  return to playing golf. Does use a RW in the middle of the night due to being groggy from sleep and doesn't want to fall.   PAIN:  Are you having pain? No  PRECAUTIONS: None  RED FLAGS: None   WEIGHT BEARING RESTRICTIONS: No  FALLS: Has patient fallen in last 6 months? No  LIVING ENVIRONMENT: Lives with: lives alone Lives in: House/apartment Stairs: Yes: Internal: to attic steps; on right going up Has following equipment at home: Vannie - 2 wheeled  PLOF: Independent  PATIENT GOALS: Return to playing golf  OBJECTIVE:  Note: Objective measures were completed at Evaluation unless otherwise noted.  VITALS: BP: 167/82 mm Hg HR: 102 BPM SPO2: 99 %  DIAGNOSTIC FINDINGS: N/A  COGNITION: Overall cognitive status: Within functional limits for tasks assessed   POSTURE: R lateral shift in standing  LOWER EXTREMITY ROM:    Notable limitations in hip extension and ankle PF with gait  LOWER EXTREMITY MMT:    MMT Right Eval Left Eval  Hip flexion 5 5  Hip extension    Hip abduction    Hip adduction    Hip internal rotation 4 4  Hip external rotation 5 5  Knee flexion 5 5  Knee extension 5 5  Ankle dorsiflexion 5 5  Ankle plantarflexion 0 reps single leg heel raise 0 reps single leg heel raise  Ankle inversion    Ankle eversion    (Blank rows = not tested)   TRANSFERS: Sit to stand: Modified independence  Assistive device utilized: None      GAIT: Pt with limited hip extension and ankle PF with limited foot clearance in swing phase bilaterally as pt fatigues with prolonged distance. Mildly crouched posture too.   FUNCTIONAL TESTS:  5 times sit to stand: 23.15 sec Timed up and go (TUG): 12.45 seconds 6 minute walk test: 1,068' 10 meter walk test: self selected: .78 m/s;  fast: 1.19 m/s  PATIENT SURVEYS:  Patient Specific Functional Scale: deferred to next session                                                                                                                               TREATMENT DATE: 01/08/24 Eval only    PATIENT EDUCATION: Education details: HEP, POC Person educated: Patient Education method: Explanation and Handouts Education comprehension: verbalized understanding and needs further education  HOME EXERCISE PROGRAM: Access Code: [email or web address] URL: [email or web address] Date: 01/08/2024 Prepared by: Dorina Kingfisher  Exercises - Sit to Stand  - 1 x daily - 7 x weekly - 3 sets - 8 reps - Heel Raises with Counter Support  - 1 x daily - 7 x weekly - 3 sets - 8 reps  GOALS: Goals reviewed with patient? No  SHORT TERM GOALS: Target date: 02/05/24  Pt will be independent with HEP to improve LE strength/endurance with gait and community level ADL's. Baseline: 01/08/24: Provided HEP Goal status: INITIAL   LONG TERM GOALS: Target date: 03/04/24  Pt will improve PSFS by at least 3 points  Baseline: 01/08/24: Deferred to next session Goal status: INITIAL  2.  Pt will improve 5xSTS to 14 seconds or less to demonstrate clinically significant improvement in LE strength for age matched norms.  Baseline: 01/08/24: 23.15 sec Goal status: INITIAL  3.  Pt will improve 6 MWT by at least 165' to demonstrate clinically significant improvement in endurance with community ambulation tasks.  Baseline: 01/08/24: 1,068'  Goal status: INITIAL  4.  Pt  will improve 10 meter self selected gait speed to at least 1.0 m/s to demonstrate clinically significant improvement for reduced falls risk with community ambulation distances.  Baseline: 01/08/24: .78 m/s Goal status: INITIAL  ASSESSMENT:  CLINICAL IMPRESSION: Patient is a 89 y.o. male who was seen today for physical therapy evaluation and treatment for generalized weakness. Pt presents with LE weakness, slowed gait speed, and limited endurance as evidenced by 5xSTS, 10 meter gait speed, and 6 MWT. Pt's time are diminished for community dwelling older adults for age matched norms  indicating significant weakness increasing falls risk and difficulty completing transfers, participation with golf, and completing community and household ADL's that require standing/walking for prolonged periods of time. Pt will benefit from skilled PT services to address these deficits to maximize return to PLOF.   OBJECTIVE IMPAIRMENTS: Abnormal gait, decreased activity tolerance, decreased mobility, difficulty walking, decreased strength, and postural dysfunction.   ACTIVITY LIMITATIONS: standing and locomotion level  PARTICIPATION LIMITATIONS: community activity, yard work, and golf  PERSONAL FACTORS: Age, Fitness, and Time since onset of injury/illness/exacerbation are also affecting patient's functional outcome.   REHAB POTENTIAL: Good  CLINICAL DECISION MAKING: Stable/uncomplicated  EVALUATION COMPLEXITY: Low  PLAN:  PT FREQUENCY: 1-2x/week  PT DURATION: 8 weeks  PLANNED INTERVENTIONS: [id number]- PT Re-evaluation, 97750- Physical Performance Testing, 97110-Therapeutic exercises, 97530- Therapeutic activity, [id number]- Neuromuscular re-education, 97535- Self Care, [id number]- Manual therapy, [phone]- Gait training, Patient/Family education, Balance training, Stair training, DME instructions, Cryotherapy, and Moist heat  PLAN FOR NEXT SESSION: Review HEP. Look at hip abduction strength in side lying. Screen hip extension AROM in prone. General LE strength/endurance.    Dorina HERO. Fairly IV, PT, DPT Physical Therapist- [place]  [facility] 01/08/2024, 2:14 PM

## 2024-01-10 ENCOUNTER — Encounter

## 2024-01-10 DIAGNOSIS — H6123 Impacted cerumen, bilateral: Secondary | ICD-10-CM | POA: Diagnosis not present

## 2024-01-10 DIAGNOSIS — H903 Sensorineural hearing loss, bilateral: Secondary | ICD-10-CM | POA: Diagnosis not present

## 2024-01-14 ENCOUNTER — Ambulatory Visit: Attending: Internal Medicine

## 2024-01-14 DIAGNOSIS — R269 Unspecified abnormalities of gait and mobility: Secondary | ICD-10-CM | POA: Insufficient documentation

## 2024-01-14 DIAGNOSIS — M6281 Muscle weakness (generalized): Secondary | ICD-10-CM | POA: Insufficient documentation

## 2024-01-14 NOTE — Therapy (Signed)
 OUTPATIENT PHYSICAL THERAPY TREATMENT   Patient Name: Dakota Parker MRN: [id number] DOB:May 01, 1931, 89 y.o., male Today's Date: 01/14/2024   PCP: Cleotilde Oneil FALCON, MD REFERRING PROVIDER: Cleotilde Oneil FALCON, MD  END OF SESSION:  PT End of Session - 01/14/24 1437     Visit Number 2    Number of Visits 17    Date for PT Re-Evaluation 03/04/24    PT Start Time 1437    PT Stop Time 1515    PT Time Calculation (min) 38 min    Activity Tolerance Patient tolerated treatment well    Behavior During Therapy [facility] for tasks assessed/performed           Past Medical History:  Diagnosis Date   Complication of anesthesia    Hypertension    Kidney stones    Past Surgical History:  Procedure Laterality Date   KNEE ARTHROSCOPY Left    KNEE ARTHROSCOPY Left 01/04/2015   Procedure: Left knee arthroscopy parital medial menisectomy, chondraplasty,;  Surgeon: Lynwood SHAUNNA Hue, MD;  Location: [facility] ORS;  Service: Orthopedics;  Laterality: Left;   There are no active problems to display for this patient.   ONSET DATE: September 11, 2023  REFERRING DIAG: R53.1 (ICD-10-CM) - Weakness  THERAPY DIAG:  Muscle weakness (generalized)  Abnormality of gait and mobility  Rationale for Evaluation and Treatment: Rehabilitation  SUBJECTIVE:                                                                                                                                                                                             SUBJECTIVE STATEMENT: Pt states that he feels weak, does not have energy. Eats well. Legs feel like they are going to give out when walking.    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 Reports symptoms ongoing for many months before (summer of 2024) going to ED and then hospital admission. Now not so much SOB, but having weakness. Roughly a 10 minute tolerance for physical activity before needing to rest. Denies falls. Knees  don't buckle, but reports need to have a seat. Has goals to return to playing golf. Does use a RW in the middle of the night due to being groggy from sleep and doesn't want to fall.   Blood pressure is controlled per pt but has white coat sydrome.     PAIN:  Are you having pain? No  PRECAUTIONS: None  RED FLAGS: None   WEIGHT BEARING RESTRICTIONS: No  FALLS: Has patient fallen in last 6 months? No  LIVING ENVIRONMENT: Lives  with: lives alone Lives in: House/apartment Stairs: Yes: Internal: to attic steps; on right going up Has following equipment at home: Vannie - 2 wheeled  PLOF: Independent  PATIENT GOALS: Return to playing golf  OBJECTIVE:  Note: Objective measures were completed at Evaluation unless otherwise noted.  VITALS: BP: 167/82 mm Hg HR: 102 BPM SPO2: 99 %  DIAGNOSTIC FINDINGS: N/A  COGNITION: Overall cognitive status: Within functional limits for tasks assessed   POSTURE: R lateral shift in standing  LOWER EXTREMITY ROM:    Notable limitations in hip extension and ankle PF with gait  LOWER EXTREMITY MMT:    MMT Right Eval Left Eval  Hip flexion 5 5  Hip extension    Hip abduction    Hip adduction    Hip internal rotation 4 4  Hip external rotation 5 5  Knee flexion 5 5  Knee extension 5 5  Ankle dorsiflexion 5 5  Ankle plantarflexion 0 reps single leg heel raise 0 reps single leg heel raise  Ankle inversion    Ankle eversion    (Blank rows = not tested)   TRANSFERS: Sit to stand: Modified independence  Assistive device utilized: None      GAIT: Pt with limited hip extension and ankle PF with limited foot clearance in swing phase bilaterally as pt fatigues with prolonged distance. Mildly crouched posture too.   FUNCTIONAL TESTS:  5 times sit to stand: 23.15 sec Timed up and go (TUG): 12.45 seconds 6 minute walk test: 1,068' 10 meter walk test: self selected: .78 m/s;  fast: 1.19 m/s  PATIENT SURVEYS:  Patient Specific  Functional Scale: deferred to next session                                                                                                                              TREATMENT DATE: 01/14/2024  Therapeutic activities Performed with the intent to improve function at home,   NuStep seat 9, arms 9, level 3 for 5 minutes   Able to maintain 90+ SPM  Standing static mini lunge with contralateral UE assist   R 10x3  L 10x3   R knee joint feeling of giving way when performing mini lunge with L LE.    97% SpO2  OMEGA   Leg extension with eccentric return for strengthening   Seat 5    R plate 15 for 8x, then 3x    L plate 15 for 8x, then 3x   Standing hip abduction with B UE assist   R 10x5 seconds    L LE feels like its going to give out.    L 10x5 seconds         Improved exercise technique, movement at target joints, use of target muscles after mod verbal, visual, tactile cues.     PATIENT EDUCATION: Education details: HEP, POC Person educated: Patient Education method: [employer] Technology [employer] Education comprehension: verbalized understanding and needs further education  HOME  EXERCISE PROGRAM: Access Code: [email or web address] URL: [email or web address] Date: 01/08/2024 Prepared by: Dorina Kingfisher  Exercises - Sit to Stand  - 1 x daily - 7 x weekly - 3 sets - 8 reps - Heel Raises with Counter Support  - 1 x daily - 7 x weekly - 3 sets - 8 reps  GOALS: Goals reviewed with patient? No  SHORT TERM GOALS: Target date: 02/05/24  Pt will be independent with HEP to improve LE strength/endurance with gait and community level ADL's. Baseline: 01/08/24: Provided HEP Goal status: INITIAL   LONG TERM GOALS: Target date: 03/04/24  Pt will improve PSFS by at least 3 points  Baseline: 01/08/24: Deferred to next session; PSFS (patient specific functional scale) Golf 5, trimming shrubbery 4, walking 3 (score of 4 on average) (01/14/2024) Goal status: INITIAL  2.  Pt will  improve 5xSTS to 14 seconds or less to demonstrate clinically significant improvement in LE strength for age matched norms.  Baseline: 01/08/24: 23.15 sec Goal status: INITIAL  3.  Pt will improve 6 MWT by at least 165' to demonstrate clinically significant improvement in endurance with community ambulation tasks.  Baseline: 01/08/24: 1,068'  Goal status: INITIAL  4.  Pt will improve 10 meter self selected gait speed to at least 1.0 m/s to demonstrate clinically significant improvement for reduced falls risk with community ambulation distances.  Baseline: 01/08/24: .78 m/s Goal status: INITIAL  ASSESSMENT:  CLINICAL IMPRESSION:  Worked on glute max, med, and quadriceps strengthening to promote ability to play golf, trim his shrubberies, and ambulate with less difficulty. Good LE muscle work reported during exercises. Demonstrates R knee joint feeling of giving way with static lunges when R LE was behind. Pt tolerated session well without aggravation of symptoms. Pt will benefit from continued skilled physical therapy services to improve strength, endurance and return to prior level of function.   OBJECTIVE IMPAIRMENTS: Abnormal gait, decreased activity tolerance, decreased mobility, difficulty walking, decreased strength, and postural dysfunction.   ACTIVITY LIMITATIONS: standing and locomotion level  PARTICIPATION LIMITATIONS: community activity, yard work, and golf  PERSONAL FACTORS: Age, Fitness, and Time since onset of injury/illness/exacerbation are also affecting patient's functional outcome.   REHAB POTENTIAL: Good  CLINICAL DECISION MAKING: Stable/uncomplicated  EVALUATION COMPLEXITY: Low  PLAN:  PT FREQUENCY: 1-2x/week  PT DURATION: 8 weeks  PLANNED INTERVENTIONS: 97164- PT Re-evaluation, 97750- Physical Performance Testing, 97110-Therapeutic exercises, 97530- Therapeutic activity, [id number]- Neuromuscular re-education, 97535- Self Care, [id number]- Manual therapy, [phone]- Gait  training, Patient/Family education, Balance training, Stair training, DME instructions, Cryotherapy, and Moist heat  PLAN FOR NEXT SESSION: Review HEP. Look at hip abduction strength in side lying. Screen hip extension AROM in prone. General LE strength/endurance.    Emil Glassman, PT, DPT Physical Therapist- [facility]  [facility] 01/14/2024, 4:26 PM

## 2024-01-16 DIAGNOSIS — E119 Type 2 diabetes mellitus without complications: Secondary | ICD-10-CM | POA: Diagnosis not present

## 2024-01-16 DIAGNOSIS — N3090 Cystitis, unspecified without hematuria: Secondary | ICD-10-CM | POA: Diagnosis not present

## 2024-01-16 DIAGNOSIS — N184 Chronic kidney disease, stage 4 (severe): Secondary | ICD-10-CM | POA: Diagnosis not present

## 2024-01-16 DIAGNOSIS — E538 Deficiency of other specified B group vitamins: Secondary | ICD-10-CM | POA: Diagnosis not present

## 2024-01-17 ENCOUNTER — Ambulatory Visit

## 2024-01-17 DIAGNOSIS — M6281 Muscle weakness (generalized): Secondary | ICD-10-CM | POA: Diagnosis not present

## 2024-01-17 DIAGNOSIS — R269 Unspecified abnormalities of gait and mobility: Secondary | ICD-10-CM

## 2024-01-17 NOTE — Therapy (Signed)
 OUTPATIENT PHYSICAL THERAPY TREATMENT   Patient Name: Dakota Parker MRN: [id number] DOB:1931/01/17, 89 y.o., male Today's Date: 01/17/2024   PCP: Cleotilde Oneil FALCON, MD REFERRING PROVIDER: Cleotilde Oneil FALCON, MD  END OF SESSION:  PT End of Session - 01/17/24 1435     Visit Number 3    Number of Visits 17    Date for PT Re-Evaluation 03/04/24    PT Start Time 1435    PT Stop Time 1515    PT Time Calculation (min) 40 min    Activity Tolerance Patient tolerated treatment well    Behavior During Therapy [facility] for tasks assessed/performed            Past Medical History:  Diagnosis Date   Complication of anesthesia    Hypertension    Kidney stones    Past Surgical History:  Procedure Laterality Date   KNEE ARTHROSCOPY Left    KNEE ARTHROSCOPY Left 01/04/2015   Procedure: Left knee arthroscopy parital medial menisectomy, chondraplasty,;  Surgeon: Lynwood SHAUNNA Hue, MD;  Location: [facility] ORS;  Service: Orthopedics;  Laterality: Left;   There are no active problems to display for this patient.   ONSET DATE: September 11, 2023  REFERRING DIAG: R53.1 (ICD-10-CM) - Weakness  THERAPY DIAG:  Muscle weakness (generalized)  Abnormality of gait and mobility  Rationale for Evaluation and Treatment: Rehabilitation  SUBJECTIVE:                                                                                                                                                                                             SUBJECTIVE STATEMENT: Pt states that he feels weak, does not have energy. Eats well. Legs feel like they are going to give out when walking.    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 Reports symptoms ongoing for many months before (summer of 2024) going to ED and then hospital admission. Now not so much SOB, but having weakness. Roughly a 10 minute tolerance for physical activity before needing to rest. Denies falls. Knees  don't buckle, but reports need to have a seat. Has goals to return to playing golf. Does use a RW in the middle of the night due to being groggy from sleep and doesn't want to fall.   Blood pressure is controlled per pt but has white coat sydrome.     PAIN:  Are you having pain? No  PRECAUTIONS: None  RED FLAGS: None   WEIGHT BEARING RESTRICTIONS: No  FALLS: Has patient fallen in last 6 months? No  LIVING ENVIRONMENT:  Lives with: lives alone Lives in: House/apartment Stairs: Yes: Internal: to attic steps; on right going up Has following equipment at home: Vannie - 2 wheeled  PLOF: Independent  PATIENT GOALS: Return to playing golf  OBJECTIVE:  Note: Objective measures were completed at Evaluation unless otherwise noted.  VITALS: BP: 167/82 mm Hg HR: 102 BPM SPO2: 99 %  DIAGNOSTIC FINDINGS: N/A  COGNITION: Overall cognitive status: Within functional limits for tasks assessed   POSTURE: R lateral shift in standing  LOWER EXTREMITY ROM:    Notable limitations in hip extension and ankle PF with gait  LOWER EXTREMITY MMT:    MMT Right Eval Left Eval  Hip flexion 5 5  Hip extension    Hip abduction    Hip adduction    Hip internal rotation 4 4  Hip external rotation 5 5  Knee flexion 5 5  Knee extension 5 5  Ankle dorsiflexion 5 5  Ankle plantarflexion 0 reps single leg heel raise 0 reps single leg heel raise  Ankle inversion    Ankle eversion    (Blank rows = not tested)   TRANSFERS: Sit to stand: Modified independence  Assistive device utilized: None      GAIT: Pt with limited hip extension and ankle PF with limited foot clearance in swing phase bilaterally as pt fatigues with prolonged distance. Mildly crouched posture too.   FUNCTIONAL TESTS:  5 times sit to stand: 23.15 sec Timed up and go (TUG): 12.45 seconds 6 minute walk test: 1,068' 10 meter walk test: self selected: .78 m/s;  fast: 1.19 m/s  PATIENT SURVEYS:  Patient Specific  Functional Scale: deferred to next session                                                                                                                              TREATMENT DATE: 01/17/2024  Therapeutic activities Performed with the intent to improve function at home,   NuStep seat 9, arms 9, level 3 for 5 minutes   Able to maintain 90+ SPM  OMEGA   Leg extension with eccentric return for strengthening   Seat 5    R plate 15 for 8x, then 5x    L plate 15 for 8x, then 5x  Standing hip abduction with B UE assist   R 10x5 seconds for 2 sets   No reports of L LE giving out.    L 10x5 seconds for 2 sets  Standing hip extension with B UE assist   R 10x5 seconds   L 10x5 seconds   Standing static mini lunge with contralateral UE assist   R 10x3  L 10x3     Improved exercise technique, movement at target joints, use of target muscles after mod verbal, visual, tactile cues.     PATIENT EDUCATION: Education details: HEP, POC Person educated: Patient Education method: Chief Technology Officer Education comprehension: verbalized understanding and needs further education  HOME EXERCISE PROGRAM: Access  Code: [email or web address] URL: [email or web address] Date: 01/08/2024 Prepared by: Dorina Kingfisher  Exercises - Sit to Stand  - 1 x daily - 7 x weekly - 3 sets - 8 reps - Heel Raises with Counter Support  - 1 x daily - 7 x weekly - 3 sets - 8 reps  GOALS: Goals reviewed with patient? No  SHORT TERM GOALS: Target date: 02/05/24  Pt will be independent with HEP to improve LE strength/endurance with gait and community level ADL's. Baseline: 01/08/24: Provided HEP Goal status: INITIAL   LONG TERM GOALS: Target date: 03/04/24  Pt will improve PSFS by at least 3 points  Baseline: 01/08/24: Deferred to next session; PSFS (patient specific functional scale) Golf 5, trimming shrubbery 4, walking 3 (score of 4 on average) (01/14/2024) Goal status: INITIAL  2.  Pt will  improve 5xSTS to 14 seconds or less to demonstrate clinically significant improvement in LE strength for age matched norms.  Baseline: 01/08/24: 23.15 sec Goal status: INITIAL  3.  Pt will improve 6 MWT by at least 165' to demonstrate clinically significant improvement in endurance with community ambulation tasks.  Baseline: 01/08/24: 1,068'  Goal status: INITIAL  4.  Pt will improve 10 meter self selected gait speed to at least 1.0 m/s to demonstrate clinically significant improvement for reduced falls risk with community ambulation distances.  Baseline: 01/08/24: .78 m/s Goal status: INITIAL  ASSESSMENT:  CLINICAL IMPRESSION:  Continued working on glute max, med, and quadriceps strengthening to promote ability to play golf, trim his shrubberies, and ambulate with less difficulty. Good muscle use reported with exercises. Pt tolerated session well without aggravation of symptoms. Pt will benefit from continued skilled physical therapy services to improve strength, endurance and return to prior level of function.   OBJECTIVE IMPAIRMENTS: Abnormal gait, decreased activity tolerance, decreased mobility, difficulty walking, decreased strength, and postural dysfunction.   ACTIVITY LIMITATIONS: standing and locomotion level  PARTICIPATION LIMITATIONS: community activity, yard work, and golf  PERSONAL FACTORS: Age, Fitness, and Time since onset of injury/illness/exacerbation are also affecting patient's functional outcome.   REHAB POTENTIAL: Good  CLINICAL DECISION MAKING: Stable/uncomplicated  EVALUATION COMPLEXITY: Low  PLAN:  PT FREQUENCY: 1-2x/week  PT DURATION: 8 weeks  PLANNED INTERVENTIONS: 97164- PT Re-evaluation, 97750- Physical Performance Testing, 97110-Therapeutic exercises, 97530- Therapeutic activity, [id number]- Neuromuscular re-education, 97535- Self Care, [id number]- Manual therapy, [phone]- Gait training, Patient/Family education, Balance training, Stair training, DME instructions,  Cryotherapy, and Moist heat  PLAN FOR NEXT SESSION: Review HEP. Look at hip abduction strength in side lying. Screen hip extension AROM in prone. General LE strength/endurance.    Emil Glassman, PT, DPT Physical Therapist- [place]  [facility] 01/17/2024, 3:19 PM

## 2024-01-22 ENCOUNTER — Ambulatory Visit

## 2024-01-22 DIAGNOSIS — M6281 Muscle weakness (generalized): Secondary | ICD-10-CM | POA: Diagnosis not present

## 2024-01-22 DIAGNOSIS — R269 Unspecified abnormalities of gait and mobility: Secondary | ICD-10-CM

## 2024-01-22 NOTE — Therapy (Signed)
 OUTPATIENT PHYSICAL THERAPY TREATMENT   Patient Name: Dakota Parker MRN: [id number] DOB:1931/02/01, 89 y.o., male Today's Date: 01/22/2024   PCP: Cleotilde Oneil FALCON, MD REFERRING PROVIDER: Cleotilde Oneil FALCON, MD  END OF SESSION:  PT End of Session - 01/22/24 1433     Visit Number 4    Number of Visits 17    Date for PT Re-Evaluation 03/04/24    PT Start Time 1433    PT Stop Time 1513    PT Time Calculation (min) 40 min    Activity Tolerance Patient tolerated treatment well    Behavior During Therapy [facility] for tasks assessed/performed             Past Medical History:  Diagnosis Date   Complication of anesthesia    Hypertension    Kidney stones    Past Surgical History:  Procedure Laterality Date   KNEE ARTHROSCOPY Left    KNEE ARTHROSCOPY Left 01/04/2015   Procedure: Left knee arthroscopy parital medial menisectomy, chondraplasty,;  Surgeon: Lynwood SHAUNNA Hue, MD;  Location: [facility] ORS;  Service: Orthopedics;  Laterality: Left;   There are no active problems to display for this patient.   ONSET DATE: September 11, 2023  REFERRING DIAG: R53.1 (ICD-10-CM) - Weakness  THERAPY DIAG:  Muscle weakness (generalized)  Abnormality of gait and mobility  Rationale for Evaluation and Treatment: Rehabilitation  SUBJECTIVE:                                                                                                                                                                                             SUBJECTIVE STATEMENT: Feels like he is getting stronger a little bit.    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 Reports symptoms ongoing for many months before (summer of 2024) going to ED and then hospital admission. Now not so much SOB, but having weakness. Roughly a 10 minute tolerance for physical activity before needing to rest. Denies falls. Knees don't buckle, but reports need to have a seat. Has goals to return  to playing golf. Does use a RW in the middle of the night due to being groggy from sleep and doesn't want to fall.   Blood pressure is controlled per pt but has white coat sydrome.    No latex allergies.     PAIN:  Are you having pain? No  PRECAUTIONS: None  RED FLAGS: None   WEIGHT BEARING RESTRICTIONS: No  FALLS: Has patient fallen in last 6 months? No  LIVING ENVIRONMENT: Lives with: lives alone Lives in: House/apartment  Stairs: Yes: Internal: to attic steps; on right going up Has following equipment at home: Walker - 2 wheeled  PLOF: Independent  PATIENT GOALS: Return to playing golf  OBJECTIVE:  Note: Objective measures were completed at Evaluation unless otherwise noted.  VITALS: BP: 167/82 mm Hg HR: 102 BPM SPO2: 99 %  DIAGNOSTIC FINDINGS: N/A  COGNITION: Overall cognitive status: Within functional limits for tasks assessed   POSTURE: R lateral shift in standing  LOWER EXTREMITY ROM:    Notable limitations in hip extension and ankle PF with gait  LOWER EXTREMITY MMT:    MMT Right Eval Left Eval  Hip flexion 5 5  Hip extension    Hip abduction    Hip adduction    Hip internal rotation 4 4  Hip external rotation 5 5  Knee flexion 5 5  Knee extension 5 5  Ankle dorsiflexion 5 5  Ankle plantarflexion 0 reps single leg heel raise 0 reps single leg heel raise  Ankle inversion    Ankle eversion    (Blank rows = not tested)   TRANSFERS: Sit to stand: Modified independence  Assistive device utilized: None      GAIT: Pt with limited hip extension and ankle PF with limited foot clearance in swing phase bilaterally as pt fatigues with prolonged distance. Mildly crouched posture too.   FUNCTIONAL TESTS:  5 times sit to stand: 23.15 sec Timed up and go (TUG): 12.45 seconds 6 minute walk test: 1,068' 10 meter walk test: self selected: .78 m/s;  fast: 1.19 m/s  PATIENT SURVEYS:  Patient Specific Functional Scale: deferred to next session                                                                                                                               TREATMENT DATE: 01/22/2024  Therapeutic activities Performed with the intent to improve function at home,   NuStep seat 9 at level 4 LE's only for 5 minutes   Able to maintain 90+ SPM  Standing with B UE assist   hip abduction, green band around ankles   R 10x2   L 10x2    LE tingling, eases with rest   Hip abduction without resistance   R 10x5 seconds    L 10x5 seconds       No LE tingling   OMEGA   Leg extension with eccentric return for strengthening   Seat 5    R plate 15 for 8x, then 5x2    L plate 15 for 8x, then 5x2  Standing hip extension with B UE assist   R 10x5 seconds for 2 sets  L 10x5 seconds for 2 sets  Standing static mini lunge with contralateral UE assist   R 10x2  L 10x2     Improved exercise technique, movement at target joints, use of target muscles after mod verbal, visual, tactile cues.     PATIENT EDUCATION: Education details: HEP, POC  Person educated: Patient Education method: Explanation and Handouts Education comprehension: verbalized understanding and needs further education  HOME EXERCISE PROGRAM: Access Code: [email or web address] URL: [email or web address] Date: 01/08/2024 Prepared by:  Exercises - Sit to Stand  - 1 x daily - 7 x weekly - 3 sets - 8 reps - Heel Raises with Counter Support  - 1 x daily - 7 x weekly - 3 sets - 8 reps  - Standing Hip Extension with Leg Bent and Support  - 1 x daily - 7 x weekly - 2 sets - 10 reps - 5 seconds hold   GOALS: Goals reviewed with patient? No  SHORT TERM GOALS: Target date: 02/05/24  Pt will be independent with HEP to improve LE strength/endurance with gait and community level ADL's. Baseline: 01/08/24: Provided HEP Goal status: INITIAL   LONG TERM GOALS: Target date: 03/04/24  Pt will improve PSFS by at least 3 points  Baseline: 01/08/24: Deferred to  next session; PSFS (patient specific functional scale) Golf 5, trimming shrubbery 4, walking 3 (score of 4 on average) (01/14/2024) Goal status: INITIAL  2.  Pt will improve 5xSTS to 14 seconds or less to demonstrate clinically significant improvement in LE strength for age matched norms.  Baseline: 01/08/24: 23.15 sec Goal status: INITIAL  3.  Pt will improve 6 MWT by at least 165' to demonstrate clinically significant improvement in endurance with community ambulation tasks.  Baseline: 01/08/24: 1,068'  Goal status: INITIAL  4.  Pt will improve 10 meter self selected gait speed to at least 1.0 m/s to demonstrate clinically significant improvement for reduced falls risk with community ambulation distances.  Baseline: 01/08/24: .78 m/s Goal status: INITIAL  ASSESSMENT:  CLINICAL IMPRESSION: Improving LE strength per subjective reports. Continued working on glute max, med, and quadriceps strengthening to promote ability to play golf, trim his shrubberies, and ambulate with less difficulty. Increased difficulty with seated leg extension exercise today. Good muscle use reported with exercises. Pt tolerated session well without aggravation of symptoms. Pt will benefit from continued skilled physical therapy services to improve strength, endurance and return to prior level of function.     OBJECTIVE IMPAIRMENTS: Abnormal gait, decreased activity tolerance, decreased mobility, difficulty walking, decreased strength, and postural dysfunction.   ACTIVITY LIMITATIONS: standing and locomotion level  PARTICIPATION LIMITATIONS: community activity, yard work, and golf  PERSONAL FACTORS: Age, Fitness, and Time since onset of injury/illness/exacerbation are also affecting patient's functional outcome.   REHAB POTENTIAL: Good  CLINICAL DECISION MAKING: Stable/uncomplicated  EVALUATION COMPLEXITY: Low  PLAN:  PT FREQUENCY: 1-2x/week  PT DURATION: 8 weeks  PLANNED INTERVENTIONS: 97164- PT  Re-evaluation, 97750- Physical Performance Testing, 97110-Therapeutic exercises, 97530- Therapeutic activity, [id number]- Neuromuscular re-education, 97535- Self Care, [id number]- Manual therapy, [phone]- Gait training, Patient/Family education, Balance training, Stair training, DME instructions, Cryotherapy, and Moist heat  PLAN FOR NEXT SESSION: Review HEP. Look at hip abduction strength in side lying. Screen hip extension AROM in prone. General LE strength/endurance.    Emil Glassman, PT, DPT Physical Therapist- [facility]  [facility] 01/22/2024, 3:14 PM

## 2024-01-23 DIAGNOSIS — E1122 Type 2 diabetes mellitus with diabetic chronic kidney disease: Secondary | ICD-10-CM | POA: Diagnosis not present

## 2024-01-23 DIAGNOSIS — N184 Chronic kidney disease, stage 4 (severe): Secondary | ICD-10-CM | POA: Diagnosis not present

## 2024-01-23 DIAGNOSIS — N1832 Chronic kidney disease, stage 3b: Secondary | ICD-10-CM | POA: Diagnosis not present

## 2024-01-23 DIAGNOSIS — L570 Actinic keratosis: Secondary | ICD-10-CM | POA: Diagnosis not present

## 2024-01-23 DIAGNOSIS — C642 Malignant neoplasm of left kidney, except renal pelvis: Secondary | ICD-10-CM | POA: Diagnosis not present

## 2024-01-23 DIAGNOSIS — I2601 Septic pulmonary embolism with acute cor pulmonale: Secondary | ICD-10-CM | POA: Diagnosis not present

## 2024-01-24 ENCOUNTER — Ambulatory Visit

## 2024-01-24 DIAGNOSIS — R269 Unspecified abnormalities of gait and mobility: Secondary | ICD-10-CM

## 2024-01-24 DIAGNOSIS — M6281 Muscle weakness (generalized): Secondary | ICD-10-CM | POA: Diagnosis not present

## 2024-01-24 NOTE — Therapy (Signed)
 OUTPATIENT PHYSICAL THERAPY TREATMENT   Patient Name: Dakota Parker MRN: [id number] DOB:1931-02-16, 89 y.o., male Today's Date: 01/24/2024   PCP: Cleotilde Oneil FALCON, MD REFERRING PROVIDER: Cleotilde Oneil FALCON, MD  END OF SESSION:  PT End of Session - 01/24/24 1433     Visit Number 5    Number of Visits 17    Date for PT Re-Evaluation 03/04/24    PT Start Time 1433    PT Stop Time 1513    PT Time Calculation (min) 40 min    Activity Tolerance Patient tolerated treatment well    Behavior During Therapy [facility] for tasks assessed/performed              Past Medical History:  Diagnosis Date   Complication of anesthesia    Hypertension    Kidney stones    Past Surgical History:  Procedure Laterality Date   KNEE ARTHROSCOPY Left    KNEE ARTHROSCOPY Left 01/04/2015   Procedure: Left knee arthroscopy parital medial menisectomy, chondraplasty,;  Surgeon: Lynwood SHAUNNA Hue, MD;  Location: [facility] ORS;  Service: Orthopedics;  Laterality: Left;   There are no active problems to display for this patient.   ONSET DATE: September 11, 2023  REFERRING DIAG: R53.1 (ICD-10-CM) - Weakness  THERAPY DIAG:  Muscle weakness (generalized)  Abnormality of gait and mobility  Rationale for Evaluation and Treatment: Rehabilitation  SUBJECTIVE:                                                                                                                                                                                             SUBJECTIVE STATEMENT: Doctor changed his blood pressure medication yesterday secondary to it causing him. Blood pressure is controlled per pt.    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 Reports symptoms ongoing for many months before (summer of 2024) going to ED and then hospital admission. Now not so much SOB, but having weakness. Roughly a 10 minute tolerance for physical activity before needing to rest. Denies falls.  Knees don't buckle, but reports need to have a seat. Has goals to return to playing golf. Does use a RW in the middle of the night due to being groggy from sleep and doesn't want to fall.   Blood pressure is controlled per pt but has white coat sydrome.    No latex allergies.     PAIN:  Are you having pain? No  PRECAUTIONS: None  RED FLAGS: None   WEIGHT BEARING RESTRICTIONS: No  FALLS: Has patient fallen in last 6 months? No  LIVING ENVIRONMENT: Lives with: lives alone Lives in: House/apartment Stairs: Yes: Internal: to attic steps; on right going up Has following equipment at home: Walker - 2 wheeled  PLOF: Independent  PATIENT GOALS: Return to playing golf  OBJECTIVE:  Note: Objective measures were completed at Evaluation unless otherwise noted.  VITALS: BP: 167/82 mm Hg HR: 102 BPM SPO2: 99 %  DIAGNOSTIC FINDINGS: N/A  COGNITION: Overall cognitive status: Within functional limits for tasks assessed   POSTURE: R lateral shift in standing  LOWER EXTREMITY ROM:    Notable limitations in hip extension and ankle PF with gait  LOWER EXTREMITY MMT:    MMT Right Eval Left Eval  Hip flexion 5 5  Hip extension    Hip abduction    Hip adduction    Hip internal rotation 4 4  Hip external rotation 5 5  Knee flexion 5 5  Knee extension 5 5  Ankle dorsiflexion 5 5  Ankle plantarflexion 0 reps single leg heel raise 0 reps single leg heel raise  Ankle inversion    Ankle eversion    (Blank rows = not tested)   TRANSFERS: Sit to stand: Modified independence  Assistive device utilized: None      GAIT: Pt with limited hip extension and ankle PF with limited foot clearance in swing phase bilaterally as pt fatigues with prolonged distance. Mildly crouched posture too.   FUNCTIONAL TESTS:  5 times sit to stand: 23.15 sec Timed up and go (TUG): 12.45 seconds 6 minute walk test: 1,068' 10 meter walk test: self selected: .78 m/s;  fast: 1.19 m/s  PATIENT  SURVEYS:  Patient Specific Functional Scale: deferred to next session                                                                                                                              TREATMENT DATE: 01/24/2024  Therapeutic activities Performed with the intent to improve function at home,   NuStep seat 9 at level 4 LE's only for 5 minutes   Able to maintain 90+ SPM   Pt states the exercise worked his muscles.   Standing with B UE assist     Hip abduction without resistance   R 10x5 seconds    L 10x5 seconds    Standing static mini squat 4x10 seconds without UE assist to challenge B quadriceps muscles.   Low back discomfort, eases with rest.   Standing with B UE assist   Hip abduction without resistance   R 10x5 seconds    L 10x5 seconds   Standing hip extension with B UE assist   R 10x5 seconds for 2 sets  L 10x5 seconds for 2 sets  Standing static mini lunge with contralateral UE assist   R 10x  L 10x      Improved exercise technique, movement at target joints, use of target muscles after mod verbal, visual, tactile cues.     PATIENT EDUCATION: Education  details: HEP, POC Person educated: Patient Education method: Explanation and Handouts Education comprehension: verbalized understanding and needs further education  HOME EXERCISE PROGRAM: Access Code: [email or web address] URL: [email or web address] Date: 01/08/2024 Prepared by:  Exercises - Sit to Stand  - 1 x daily - 7 x weekly - 3 sets - 8 reps - Heel Raises with Counter Support  - 1 x daily - 7 x weekly - 3 sets - 8 reps  - Standing Hip Extension with Leg Bent and Support  - 1 x daily - 7 x weekly - 2 sets - 10 reps - 5 seconds hold   GOALS: Goals reviewed with patient? No  SHORT TERM GOALS: Target date: 02/05/24  Pt will be independent with HEP to improve LE strength/endurance with gait and community level ADL's. Baseline: 01/08/24: Provided HEP Goal status: INITIAL   LONG TERM  GOALS: Target date: 03/04/24  Pt will improve PSFS by at least 3 points  Baseline: 01/08/24: Deferred to next session; PSFS (patient specific functional scale) Golf 5, trimming shrubbery 4, walking 3 (score of 4 on average) (01/14/2024) Goal status: INITIAL  2.  Pt will improve 5xSTS to 14 seconds or less to demonstrate clinically significant improvement in LE strength for age matched norms.  Baseline: 01/08/24: 23.15 sec Goal status: INITIAL  3.  Pt will improve 6 MWT by at least 165' to demonstrate clinically significant improvement in endurance with community ambulation tasks.  Baseline: 01/08/24: 1,068'  Goal status: INITIAL  4.  Pt will improve 10 meter self selected gait speed to at least 1.0 m/s to demonstrate clinically significant improvement for reduced falls risk with community ambulation distances.  Baseline: 01/08/24: .78 m/s Goal status: INITIAL  ASSESSMENT:  CLINICAL IMPRESSION: Continued working on B glute and quadriceps strengthening to improve ability to perform standing tasks such as golf and yard work with less difficulty. Good muscle use reported with exercises.  Pt tolerated session well without aggravation of symptoms. Pt will benefit from continued skilled physical therapy services to improve strength, endurance and return to prior level of function.     OBJECTIVE IMPAIRMENTS: Abnormal gait, decreased activity tolerance, decreased mobility, difficulty walking, decreased strength, and postural dysfunction.   ACTIVITY LIMITATIONS: standing and locomotion level  PARTICIPATION LIMITATIONS: community activity, yard work, and golf  PERSONAL FACTORS: Age, Fitness, and Time since onset of injury/illness/exacerbation are also affecting patient's functional outcome.   REHAB POTENTIAL: Good  CLINICAL DECISION MAKING: Stable/uncomplicated  EVALUATION COMPLEXITY: Low  PLAN:  PT FREQUENCY: 1-2x/week  PT DURATION: 8 weeks  PLANNED INTERVENTIONS: 97164- PT Re-evaluation,  97750- Physical Performance Testing, 97110-Therapeutic exercises, 97530- Therapeutic activity, [id number]- Neuromuscular re-education, 97535- Self Care, [id number]- Manual therapy, [phone]- Gait training, Patient/Family education, Balance training, Stair training, DME instructions, Cryotherapy, and Moist heat  PLAN FOR NEXT SESSION: Review HEP. Look at hip abduction strength in side lying. Screen hip extension AROM in prone. General LE strength/endurance.    Emil Glassman, PT, DPT Physical Therapist- [facility] 01/24/2024, 3:13 PM

## 2024-01-29 ENCOUNTER — Ambulatory Visit

## 2024-01-29 DIAGNOSIS — M6281 Muscle weakness (generalized): Secondary | ICD-10-CM

## 2024-01-29 DIAGNOSIS — R269 Unspecified abnormalities of gait and mobility: Secondary | ICD-10-CM

## 2024-01-29 NOTE — Therapy (Signed)
 OUTPATIENT PHYSICAL THERAPY TREATMENT   Patient Name: Dakota Parker MRN: [id number] DOB:[birth year], 89 y.o., male Today's Date: 01/29/2024   PCP: Cleotilde Oneil FALCON, MD REFERRING PROVIDER: Cleotilde Oneil FALCON, MD  END OF SESSION:  PT End of Session - 01/29/24 1431     Visit Number 6    Number of Visits 17    Date for PT Re-Evaluation 03/04/24    PT Start Time 1431    PT Stop Time 1510    PT Time Calculation (min) 39 min    Activity Tolerance Patient tolerated treatment well    Behavior During Therapy [facility] for tasks assessed/performed               Past Medical History:  Diagnosis Date   Complication of anesthesia    Hypertension    Kidney stones    Past Surgical History:  Procedure Laterality Date   KNEE ARTHROSCOPY Left    KNEE ARTHROSCOPY Left 01/04/2015   Procedure: Left knee arthroscopy parital medial menisectomy, chondraplasty,;  Surgeon: Lynwood SHAUNNA Hue, MD;  Location: [facility] ORS;  Service: Orthopedics;  Laterality: Left;   There are no active problems to display for this patient.   ONSET DATE: September 11, 2023  REFERRING DIAG: R53.1 (ICD-10-CM) - Weakness  THERAPY DIAG:  Muscle weakness (generalized)  Abnormality of gait and mobility  Rationale for Evaluation and Treatment: Rehabilitation  SUBJECTIVE:                                                                                                                                                                                             SUBJECTIVE STATEMENT: Wife is at the hospital this morning to get her eye operated on.    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 Reports symptoms ongoing for many months before (summer of 2024) going to ED and then hospital admission. Now not so much SOB, but having weakness. Roughly a 10 minute tolerance for physical activity before needing to rest. Denies falls. Knees don't buckle, but reports need to have a seat.  Has goals to return to playing golf. Does use a RW in the middle of the night due to being groggy from sleep and doesn't want to fall.   Blood pressure is controlled per pt but has white coat sydrome.    No latex allergies.     PAIN:  Are you having pain? No  PRECAUTIONS: None  RED FLAGS: None   WEIGHT BEARING RESTRICTIONS: No  FALLS: Has patient fallen in last 6 months? No  LIVING ENVIRONMENT: Lives  with: lives alone Lives in: House/apartment Stairs: Yes: Internal: to attic steps; on right going up Has following equipment at home: Dakota Parker - 2 wheeled  PLOF: Independent  PATIENT GOALS: Return to playing golf  OBJECTIVE:  Note: Objective measures were completed at Evaluation unless otherwise noted.  VITALS: BP: 167/82 mm Hg HR: 102 BPM SPO2: 99 %  DIAGNOSTIC FINDINGS: N/A  COGNITION: Overall cognitive status: Within functional limits for tasks assessed   POSTURE: R lateral shift in standing  LOWER EXTREMITY ROM:    Notable limitations in hip extension and ankle PF with gait  LOWER EXTREMITY MMT:    MMT Right Eval Left Eval  Hip flexion 5 5  Hip extension    Hip abduction    Hip adduction    Hip internal rotation 4 4  Hip external rotation 5 5  Knee flexion 5 5  Knee extension 5 5  Ankle dorsiflexion 5 5  Ankle plantarflexion 0 reps single leg heel raise 0 reps single leg heel raise  Ankle inversion    Ankle eversion    (Blank rows = not tested)   TRANSFERS: Sit to stand: Modified independence  Assistive device utilized: None      GAIT: Pt with limited hip extension and ankle PF with limited foot clearance in swing phase bilaterally as pt fatigues with prolonged distance. Mildly crouched posture too.   FUNCTIONAL TESTS:  5 times sit to stand: 23.15 sec Timed up and go (TUG): 12.45 seconds 6 minute walk test: 1,068' 10 meter walk test: self selected: .78 m/s;  fast: 1.19 m/s  PATIENT SURVEYS:  Patient Specific Functional Scale: deferred  to next session                                                                                                                              TREATMENT DATE: 01/29/2024  Therapeutic activities Performed with the intent to improve function at home and ability to perform transfers and standing tasks with less difficulty.   NuStep seat 9 at level 4 LE's only for 5 minutes   Able to maintain 90+ SPM  Sit <> stand from regular chair 5x with B UE assist  Unable to perform without UE assist   Then 5x quickly with B UE assist    12.82 seconds   Standing with B UE assist    Hip abduction without resistance   R 10x5 seconds for 2 sets   L 10x5 seconds for 2 sets  Standing hip extension with B UE assist   R 10x5 seconds for 2 sets  L 10x5 seconds for 2 sets  Standing static mini lunge with contralateral UE assist   R 10x2  L 10x2      Improved exercise technique, movement at target joints, use of target muscles after mod verbal, visual, tactile cues.     PATIENT EDUCATION: Education details: HEP, POC Person educated: Patient Education method: [employer] Technology [employer] Education comprehension: verbalized  understanding and needs further education  HOME EXERCISE PROGRAM: Access Code: [email or web address] URL: [email or web address] Date: 01/08/2024 Prepared by:  Exercises - Sit to Stand  - 1 x daily - 7 x weekly - 3 sets - 8 reps - Heel Raises with Counter Support  - 1 x daily - 7 x weekly - 3 sets - 8 reps  - Standing Hip Extension with Leg Bent and Support  - 1 x daily - 7 x weekly - 2 sets - 10 reps - 5 seconds hold   GOALS: Goals reviewed with patient? No  SHORT TERM GOALS: Target date: 02/05/24  Pt will be independent with HEP to improve LE strength/endurance with gait and community level ADL's. Baseline: 01/08/24: Provided HEP Goal status: INITIAL   LONG TERM GOALS: Target date: 03/04/24  Pt will improve PSFS by at least 3 points  Baseline: 01/08/24: Deferred  to next session; PSFS (patient specific functional scale) Golf 5, trimming shrubbery 4, walking 3 (score of 4 on average) (01/14/2024) Goal status: INITIAL  2.  Pt will improve 5xSTS to 14 seconds or less to demonstrate clinically significant improvement in LE strength for age matched norms.  Baseline: 01/08/24: 23.15 sec; 12.82 seconds with B UE assist (01/29/2024) Goal status: MET  3.  Pt will improve 6 MWT by at least 165' to demonstrate clinically significant improvement in endurance with community ambulation tasks.  Baseline: 01/08/24: 1,068'  Goal status: INITIAL  4.  Pt will improve 10 meter self selected gait speed to at least 1.0 m/s to demonstrate clinically significant improvement for reduced falls risk with community ambulation distances.  Baseline: 01/08/24: .78 m/s Goal status: INITIAL  ASSESSMENT:  CLINICAL IMPRESSION: Pt demonstrates improved 5 times sit <> stand time suggesting improved functional LE strength. Continued working on B glute and quadriceps strengthening to improve ability to perform standing tasks such as golf and yard work with less difficulty.  Pt tolerated session well without aggravation of symptoms. Pt will benefit from continued skilled physical therapy services to improve strength, endurance and return to prior level of function.     OBJECTIVE IMPAIRMENTS: Abnormal gait, decreased activity tolerance, decreased mobility, difficulty walking, decreased strength, and postural dysfunction.   ACTIVITY LIMITATIONS: standing and locomotion level  PARTICIPATION LIMITATIONS: community activity, yard work, and golf  PERSONAL FACTORS: Age, Fitness, and Time since onset of injury/illness/exacerbation are also affecting patient's functional outcome.   REHAB POTENTIAL: Good  CLINICAL DECISION MAKING: Stable/uncomplicated  EVALUATION COMPLEXITY: Low  PLAN:  PT FREQUENCY: 1-2x/week  PT DURATION: 8 weeks  PLANNED INTERVENTIONS: 97164- PT Re-evaluation, 97750-  Physical Performance Testing, 97110-Therapeutic exercises, 97530- Therapeutic activity, [id number]- Neuromuscular re-education, 97535- Self Care, [id number]- Manual therapy, [phone]- Gait training, Patient/Family education, Balance training, Stair training, DME instructions, Cryotherapy, and Moist heat  PLAN FOR NEXT SESSION: Review HEP. Look at hip abduction strength in side lying. Screen hip extension AROM in prone. General LE strength/endurance.    Emil Glassman, PT, DPT Physical Therapist- [facility]  [facility] 01/29/2024, 3:12 PM

## 2024-01-31 ENCOUNTER — Ambulatory Visit

## 2024-01-31 DIAGNOSIS — M6281 Muscle weakness (generalized): Secondary | ICD-10-CM | POA: Diagnosis not present

## 2024-01-31 DIAGNOSIS — R269 Unspecified abnormalities of gait and mobility: Secondary | ICD-10-CM

## 2024-01-31 NOTE — Therapy (Signed)
 OUTPATIENT PHYSICAL THERAPY TREATMENT   Patient Name: SANDIP POWER MRN: [id number] DOB:[birth year], 89 y.o., male Today's Date: 01/31/2024   PCP: Cleotilde Oneil FALCON, MD REFERRING PROVIDER: Cleotilde Oneil FALCON, MD  END OF SESSION:  PT End of Session - 01/31/24 1435     Visit Number 7    Number of Visits 17    Date for PT Re-Evaluation 03/04/24    PT Start Time 1435    PT Stop Time 1513    PT Time Calculation (min) 38 min    Activity Tolerance Patient tolerated treatment well    Behavior During Therapy [facility] for tasks assessed/performed                Past Medical History:  Diagnosis Date   Complication of anesthesia    Hypertension    Kidney stones    Past Surgical History:  Procedure Laterality Date   KNEE ARTHROSCOPY Left    KNEE ARTHROSCOPY Left 01/04/2015   Procedure: Left knee arthroscopy parital medial menisectomy, chondraplasty,;  Surgeon: Lynwood SHAUNNA Hue, MD;  Location: [facility] ORS;  Service: Orthopedics;  Laterality: Left;   There are no active problems to display for this patient.   ONSET DATE: September 11, 2023  REFERRING DIAG: R53.1 (ICD-10-CM) - Weakness  THERAPY DIAG:  Muscle weakness (generalized)  Abnormality of gait and mobility  Rationale for Evaluation and Treatment: Rehabilitation  SUBJECTIVE:                                                                                                                                                                                             SUBJECTIVE STATEMENT: Walking is ok. Thinks its getting a little better. Gets out of breath when he does the shrubbery ever since he had a blood clot in his lungs as well as blood thinners and blood pressure medicine.     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 Reports symptoms ongoing for many months before (summer of 2024) going to ED and then hospital admission. Now not so much SOB, but having weakness. Roughly  a 10 minute tolerance for physical activity before needing to rest. Denies falls. Knees don't buckle, but reports need to have a seat. Has goals to return to playing golf. Does use a RW in the middle of the night due to being groggy from sleep and doesn't want to fall.   Blood pressure is controlled per pt but has white coat sydrome.    No latex allergies.     PAIN:  Are you having pain? No  PRECAUTIONS: None  RED FLAGS: None   WEIGHT BEARING RESTRICTIONS: No  FALLS: Has patient fallen in last 6 months? No  LIVING ENVIRONMENT: Lives with: lives alone Lives in: House/apartment Stairs: Yes: Internal: to attic steps; on right going up Has following equipment at home: Walker - 2 wheeled  PLOF: Independent  PATIENT GOALS: Return to playing golf  OBJECTIVE:  Note: Objective measures were completed at Evaluation unless otherwise noted.  VITALS: BP: 167/82 mm Hg HR: 102 BPM SPO2: 99 %  DIAGNOSTIC FINDINGS: N/A  COGNITION: Overall cognitive status: Within functional limits for tasks assessed   POSTURE: R lateral shift in standing  LOWER EXTREMITY ROM:    Notable limitations in hip extension and ankle PF with gait  LOWER EXTREMITY MMT:    MMT Right Eval Left Eval  Hip flexion 5 5  Hip extension    Hip abduction    Hip adduction    Hip internal rotation 4 4  Hip external rotation 5 5  Knee flexion 5 5  Knee extension 5 5  Ankle dorsiflexion 5 5  Ankle plantarflexion 0 reps single leg heel raise 0 reps single leg heel raise  Ankle inversion    Ankle eversion    (Blank rows = not tested)   TRANSFERS: Sit to stand: Modified independence  Assistive device utilized: None      GAIT: Pt with limited hip extension and ankle PF with limited foot clearance in swing phase bilaterally as pt fatigues with prolonged distance. Mildly crouched posture too.   FUNCTIONAL TESTS:  5 times sit to stand: 23.15 sec Timed up and go (TUG): 12.45 seconds 6 minute walk test:  1,068' 10 meter walk test: self selected: .78 m/s;  fast: 1.19 m/s  PATIENT SURVEYS:  Patient Specific Functional Scale: deferred to next session                                                                                                                              TREATMENT DATE: 01/31/2024  Therapeutic activities Performed with the intent to improve function at home and ability to perform transfers and standing tasks with less difficulty.   Gait x 6 minutes  1010 ft   Standing static mini lunge with contralateral UE assist   R 10x3  L 10x3  Standing with B UE assist   Hip abduction without resistance   R 10x5 seconds for 2 sets   L 10x5 seconds for 2 sets  Standing hip extension with B UE assist   R 10x5 seconds for 2 sets  L 10x5 seconds for 2 sets  Standing ball toss to trampoline for balance while performing UE tasks 1 kg 20x  Feet shoulder width apart   Feels out of breath afterwards  95% SpO2 room air  Pursed lip breathing performed afterwards  98% SpO2 room air afterwards.    Improved exercise technique, movement at target joints, use of target muscles after mod verbal, visual,  tactile cues.         PATIENT EDUCATION: Education details: HEP, POC Person educated: Patient Education method: Chief Technology Officer Education comprehension: verbalized understanding and needs further education  HOME EXERCISE PROGRAM: Access Code: [email or web address] URL: [email or web address] Date: 01/08/2024 Prepared by:  Exercises - Sit to Stand  - 1 x daily - 7 x weekly - 3 sets - 8 reps - Heel Raises with Counter Support  - 1 x daily - 7 x weekly - 3 sets - 8 reps  - Standing Hip Extension with Leg Bent and Support  - 1 x daily - 7 x weekly - 2 sets - 10 reps - 5 seconds hold   GOALS: Goals reviewed with patient? No  SHORT TERM GOALS: Target date: 02/05/24  Pt will be independent with HEP to improve LE strength/endurance with gait and community level  ADL's. Baseline: 01/08/24: Provided HEP Goal status: INITIAL   LONG TERM GOALS: Target date: 03/04/24  Pt will improve PSFS by at least 3 points  Baseline: 01/08/24: Deferred to next session; PSFS (patient specific functional scale) Golf 5, trimming shrubbery 4, walking 3 (score of 4 on average) (01/14/2024) Goal status: INITIAL  2.  Pt will improve 5xSTS to 14 seconds or less to demonstrate clinically significant improvement in LE strength for age matched norms.  Baseline: 01/08/24: 23.15 sec; 12.82 seconds with B UE assist (01/29/2024) Goal status: MET  3.  Pt will improve 6 MWT by at least 165' to demonstrate clinically significant improvement in endurance with community ambulation tasks.  Baseline: 01/08/24: 1,068'; 1010 ft (01/31/2024) Goal status: ONGOING  4.  Pt will improve 10 meter self selected gait speed to at least 1.0 m/s to demonstrate clinically significant improvement for reduced falls risk with community ambulation distances.  Baseline: 01/08/24: .78 m/s Goal status: INITIAL  ASSESSMENT:  CLINICAL IMPRESSION:   Continued working on B glute and quadriceps strengthening to improve ability to perform standing tasks such as golf and yard work with less difficulty as well as to promote increased activity tolerance. Also worked on breathing to improve oxygenation. Good muscle use reported with exercises. Pt tolerated session well without aggravation of symptoms. Pt will benefit from continued skilled physical therapy services to improve strength, endurance and return to prior level of function.     OBJECTIVE IMPAIRMENTS: Abnormal gait, decreased activity tolerance, decreased mobility, difficulty walking, decreased strength, and postural dysfunction.   ACTIVITY LIMITATIONS: standing and locomotion level  PARTICIPATION LIMITATIONS: community activity, yard work, and golf  PERSONAL FACTORS: Age, Fitness, and Time since onset of injury/illness/exacerbation are also affecting  patient's functional outcome.   REHAB POTENTIAL: Good  CLINICAL DECISION MAKING: Stable/uncomplicated  EVALUATION COMPLEXITY: Low  PLAN:  PT FREQUENCY: 1-2x/week  PT DURATION: 8 weeks  PLANNED INTERVENTIONS: 97164- PT Re-evaluation, 97750- Physical Performance Testing, 97110-Therapeutic exercises, 97530- Therapeutic activity, [id number]- Neuromuscular re-education, 97535- Self Care, [id number]- Manual therapy, [phone]- Gait training, Patient/Family education, Balance training, Stair training, DME instructions, Cryotherapy, and Moist heat  PLAN FOR NEXT SESSION: Review HEP. Look at hip abduction strength in side lying. Screen hip extension AROM in prone. General LE strength/endurance.    Emil Glassman, PT, DPT Physical Therapist- [facility]  [facility] 01/31/2024, 4:08 PM

## 2024-02-05 ENCOUNTER — Ambulatory Visit

## 2024-02-05 DIAGNOSIS — M6281 Muscle weakness (generalized): Secondary | ICD-10-CM

## 2024-02-05 DIAGNOSIS — R269 Unspecified abnormalities of gait and mobility: Secondary | ICD-10-CM

## 2024-02-05 NOTE — Therapy (Signed)
 OUTPATIENT PHYSICAL THERAPY TREATMENT   Patient Name: Dakota Parker MRN: [id number] DOB:11/26/1930, 89 y.o., male Today's Date: 02/05/2024   PCP: Cleotilde Oneil FALCON, MD REFERRING PROVIDER: Cleotilde Oneil FALCON, MD  END OF SESSION:  PT End of Session - 02/05/24 1434     Visit Number 8    Number of Visits 17    Date for PT Re-Evaluation 03/04/24    PT Start Time 1434    PT Stop Time 1518    PT Time Calculation (min) 44 min    Activity Tolerance Patient tolerated treatment well    Behavior During Therapy [facility] for tasks assessed/performed                 Past Medical History:  Diagnosis Date   Complication of anesthesia    Hypertension    Kidney stones    Past Surgical History:  Procedure Laterality Date   KNEE ARTHROSCOPY Left    KNEE ARTHROSCOPY Left 01/04/2015   Procedure: Left knee arthroscopy parital medial menisectomy, chondraplasty,;  Surgeon: Lynwood SHAUNNA Hue, MD;  Location: [facility] ORS;  Service: Orthopedics;  Laterality: Left;   There are no active problems to display for this patient.   ONSET DATE: September 11, 2023  REFERRING DIAG: R53.1 (ICD-10-CM) - Weakness  THERAPY DIAG:  Muscle weakness (generalized)  Abnormality of gait and mobility  Rationale for Evaluation and Treatment: Rehabilitation  SUBJECTIVE:                                                                                                                                                                                             SUBJECTIVE STATEMENT: Walking seems to be alright. Also got Brooks shoes since last Thursday. Does not know if he is any stronger.    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 Reports symptoms ongoing for many months before (summer of 2024) going to ED and then hospital admission. Now not so much SOB, but having weakness. Roughly a 10 minute tolerance for physical activity before needing to rest. Denies falls. Knees  don't buckle, but reports need to have a seat. Has goals to return to playing golf. Does use a RW in the middle of the night due to being groggy from sleep and doesn't want to fall.   Blood pressure is controlled per pt but has white coat sydrome.    No latex allergies.     PAIN:  Are you having pain? No  PRECAUTIONS: None  RED FLAGS: None   WEIGHT BEARING RESTRICTIONS: No  FALLS: Has patient fallen  in last 6 months? No  LIVING ENVIRONMENT: Lives with: lives alone Lives in: House/apartment Stairs: Yes: Internal: to attic steps; on right going up Has following equipment at home: Walker - 2 wheeled  PLOF: Independent  PATIENT GOALS: Return to playing golf  OBJECTIVE:  Note: Objective measures were completed at Evaluation unless otherwise noted.  VITALS: BP: 167/82 mm Hg HR: 102 BPM SPO2: 99 %  DIAGNOSTIC FINDINGS: N/A  COGNITION: Overall cognitive status: Within functional limits for tasks assessed   POSTURE: R lateral shift in standing  LOWER EXTREMITY ROM:    Notable limitations in hip extension and ankle PF with gait  LOWER EXTREMITY MMT:    MMT Right Eval Left Eval  Hip flexion 5 5  Hip extension    Hip abduction    Hip adduction    Hip internal rotation 4 4  Hip external rotation 5 5  Knee flexion 5 5  Knee extension 5 5  Ankle dorsiflexion 5 5  Ankle plantarflexion 0 reps single leg heel raise 0 reps single leg heel raise  Ankle inversion    Ankle eversion    (Blank rows = not tested)   TRANSFERS: Sit to stand: Modified independence  Assistive device utilized: None      GAIT: Pt with limited hip extension and ankle PF with limited foot clearance in swing phase bilaterally as pt fatigues with prolonged distance. Mildly crouched posture too.   FUNCTIONAL TESTS:  5 times sit to stand: 23.15 sec Timed up and go (TUG): 12.45 seconds 6 minute walk test: 1,068' 10 meter walk test: self selected: .78 m/s;  fast: 1.19 m/s  PATIENT SURVEYS:   Patient Specific Functional Scale: deferred to next session                                                                                                                              TREATMENT DATE: 02/05/2024  Therapeutic activities Performed with the intent to improve function at home and ability to perform transfers and standing tasks with less difficulty.   NuStep seat 9 at level 4 LE's only for 5 minutes                Able to maintain 90+ SPM  Standing hip machine height 5  Hip extension    R plate 55 for 89k7, then 7x. L knee discomfort during 3rd set   L plate 55 for 89k6   Hip abduction    L plate 25 for 89k6   R plate 25 for 89k6  Sled push 30 ft no weight 5x  Then 20 lbs for 4x  Then 30 lbs for 4x   OMEGA                Leg extension with eccentric return for strengthening                        Seat 5  R plate 15 for 89k7                                   L plate 15 for 89k7     Improved number of repetitions compared to previous sessions.        Improved exercise technique, movement at target joints, use of target muscles after mod verbal, visual, tactile cues.         PATIENT EDUCATION: Education details: HEP, POC Person educated: Patient Education method: [employer] Technology [employer] Education comprehension: verbalized understanding and needs further education  HOME EXERCISE PROGRAM: Access Code: [email or web address] URL: [email or web address] Date: 01/08/2024 Prepared by:  Exercises - Sit to Stand  - 1 x daily - 7 x weekly - 3 sets - 8 reps - Heel Raises with Counter Support  - 1 x daily - 7 x weekly - 3 sets - 8 reps  - Standing Hip Extension with Leg Bent and Support  - 1 x daily - 7 x weekly - 2 sets - 10 reps - 5 seconds hold   GOALS: Goals reviewed with patient? No  SHORT TERM GOALS: Target date: 02/05/24  Pt will be independent with HEP to improve LE strength/endurance with gait and community  level ADL's. Baseline: 01/08/24: Provided HEP Goal status: INITIAL   LONG TERM GOALS: Target date: 03/04/24  Pt will improve PSFS by at least 3 points  Baseline: 01/08/24: Deferred to next session; PSFS (patient specific functional scale) Golf 5, trimming shrubbery 4, walking 3 (score of 4 on average) (01/14/2024) Goal status: INITIAL  2.  Pt will improve 5xSTS to 14 seconds or less to demonstrate clinically significant improvement in LE strength for age matched norms.  Baseline: 01/08/24: 23.15 sec; 12.82 seconds with B UE assist (01/29/2024) Goal status: MET  3.  Pt will improve 6 MWT by at least 165' to demonstrate clinically significant improvement in endurance with community ambulation tasks.  Baseline: 01/08/24: 1,068'; 1010 ft (01/31/2024) Goal status: ONGOING  4.  Pt will improve 10 meter self selected gait speed to at least 1.0 m/s to demonstrate clinically significant improvement for reduced falls risk with community ambulation distances.  Baseline: 01/08/24: .78 m/s Goal status: INITIAL  ASSESSMENT:  CLINICAL IMPRESSION:  Continued working on improving B glute med, max, and quadriceps strength as well as improving activity tolerance to promote ability to perform tasks at home such as working on his shrubbery with less fatigue. Pt able to perform increased repetitions per set with seated leg extension compared to previous sessions suggesting improved quadriceps strength. Pt tolerated session well without aggravation of symptoms. Pt will benefit from continued skilled physical therapy services to improve strength, endurance and return to prior level of function.     OBJECTIVE IMPAIRMENTS: Abnormal gait, decreased activity tolerance, decreased mobility, difficulty walking, decreased strength, and postural dysfunction.   ACTIVITY LIMITATIONS: standing and locomotion level  PARTICIPATION LIMITATIONS: community activity, yard work, and golf  PERSONAL FACTORS: Age, Fitness, and Time  since onset of injury/illness/exacerbation are also affecting patient's functional outcome.   REHAB POTENTIAL: Good  CLINICAL DECISION MAKING: Stable/uncomplicated  EVALUATION COMPLEXITY: Low  PLAN:  PT FREQUENCY: 1-2x/week  PT DURATION: 8 weeks  PLANNED INTERVENTIONS: 97164- PT Re-evaluation, 97750- Physical Performance Testing, 97110-Therapeutic exercises, 97530- Therapeutic activity, [id number]- Neuromuscular re-education, 97535- Self Care, [id number]- Manual therapy, [phone]- Gait training, Patient/Family education, Balance training, Stair training, DME instructions, Cryotherapy, and Moist heat  PLAN FOR  NEXT SESSION: Review HEP. Look at hip abduction strength in side lying. Screen hip extension AROM in prone. General LE strength/endurance.    Emil Glassman, PT, DPT Physical Therapist- [place]  [facility] 02/05/2024, 4:27 PM

## 2024-02-07 ENCOUNTER — Ambulatory Visit

## 2024-02-07 ENCOUNTER — Encounter: Payer: Self-pay | Admitting: Physical Therapy

## 2024-02-07 DIAGNOSIS — M6281 Muscle weakness (generalized): Secondary | ICD-10-CM | POA: Diagnosis not present

## 2024-02-07 DIAGNOSIS — R269 Unspecified abnormalities of gait and mobility: Secondary | ICD-10-CM

## 2024-02-07 NOTE — Therapy (Signed)
 OUTPATIENT PHYSICAL THERAPY TREATMENT   Patient Name: Dakota Parker MRN: [id number] DOB:08-Aug-1930, 89 y.o., male Today's Date: 02/07/2024   PCP: Cleotilde Oneil FALCON, MD REFERRING PROVIDER: Cleotilde Oneil FALCON, MD  END OF SESSION:  PT End of Session - 02/07/24 1642     Visit Number 9    Number of Visits 17    Date for PT Re-Evaluation 03/04/24    PT Start Time 1645    PT Stop Time 1726    PT Time Calculation (min) 41 min    Activity Tolerance Patient tolerated treatment well    Behavior During Therapy [facility] for tasks assessed/performed                  Past Medical History:  Diagnosis Date   Complication of anesthesia    Hypertension    Kidney stones    Past Surgical History:  Procedure Laterality Date   KNEE ARTHROSCOPY Left    KNEE ARTHROSCOPY Left 01/04/2015   Procedure: Left knee arthroscopy parital medial menisectomy, chondraplasty,;  Surgeon: Lynwood SHAUNNA Hue, MD;  Location: [facility] ORS;  Service: Orthopedics;  Laterality: Left;   There are no active problems to display for this patient.   ONSET DATE: September 11, 2023  REFERRING DIAG: R53.1 (ICD-10-CM) - Weakness  THERAPY DIAG:  Muscle weakness (generalized)  Abnormality of gait and mobility  Rationale for Evaluation and Treatment: Rehabilitation  SUBJECTIVE:                                                                                                                                                                                             SUBJECTIVE STATEMENT:  Patient with no new complaints this date.   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 Reports symptoms ongoing for many months before (summer of 2024) going to ED and then hospital admission. Now not so much SOB, but having weakness. Roughly a 10 minute tolerance for physical activity before needing to rest. Denies falls. Knees don't buckle, but reports need to have a seat. Has goals to  return to playing golf. Does use a RW in the middle of the night due to being groggy from sleep and doesn't want to fall.   Blood pressure is controlled per pt but has white coat sydrome.    No latex allergies.     PAIN:  Are you having pain? No  PRECAUTIONS: None  RED FLAGS: None   WEIGHT BEARING RESTRICTIONS: No  FALLS: Has patient fallen in last 6 months? No  LIVING ENVIRONMENT: Lives with: lives alone  Lives in: House/apartment Stairs: Yes: Internal: to attic steps; on right going up Has following equipment at home: Sadye Kiernan - 2 wheeled  PLOF: Independent  PATIENT GOALS: Return to playing golf  OBJECTIVE:  Note: Objective measures were completed at Evaluation unless otherwise noted.  VITALS: BP: 167/82 mm Hg HR: 102 BPM SPO2: 99 %  DIAGNOSTIC FINDINGS: N/A  COGNITION: Overall cognitive status: Within functional limits for tasks assessed   POSTURE: R lateral shift in standing  LOWER EXTREMITY ROM:    Notable limitations in hip extension and ankle PF with gait  LOWER EXTREMITY MMT:    MMT Right Eval Left Eval  Hip flexion 5 5  Hip extension    Hip abduction    Hip adduction    Hip internal rotation 4 4  Hip external rotation 5 5  Knee flexion 5 5  Knee extension 5 5  Ankle dorsiflexion 5 5  Ankle plantarflexion 0 reps single leg heel raise 0 reps single leg heel raise  Ankle inversion    Ankle eversion    (Blank rows = not tested)   TRANSFERS: Sit to stand: Modified independence  Assistive device utilized: None      GAIT: Pt with limited hip extension and ankle PF with limited foot clearance in swing phase bilaterally as pt fatigues with prolonged distance. Mildly crouched posture too.   FUNCTIONAL TESTS:  5 times sit to stand: 23.15 sec Timed up and go (TUG): 12.45 seconds 6 minute walk test: 1,068' 10 meter walk test: self selected: .78 m/s;  fast: 1.19 m/s  PATIENT SURVEYS:  Patient Specific Functional Scale: deferred to next  session                                                                                                                              TREATMENT DATE: 02/07/24   Therapeutic activities Performed with the intent to improve function at home and ability to perform transfers and standing tasks with less difficulty.   NuStep seat 9 at level 4 LE's only for 5 minutes                Able to maintain 90+ SPM  Standing hip machine height 5    Hip abduction 25# 3 x 10 each LE   Hip flexion 40# 3 x 10 each LE   Sled push   Then 30 lbs for 6x  Sit to stand 2 x 10 with no UE support while sitting on 1 airex pad   Lateral step over (6) 6 hurdles x 4 laps  Fwd step over (6) 6 hurdles x 4 laps   Improved exercise technique, movement at target joints, use of target muscles after mod verbal, visual, tactile cues.   PATIENT EDUCATION: Education details: HEP, POC Person educated: Patient Education method: Chief Technology Officer Education comprehension: verbalized understanding and needs further education  HOME EXERCISE PROGRAM: Access Code: [email or web address] URL: [email or web address] Date: 01/08/2024 Prepared by:  Exercises -  Sit to Stand  - 1 x daily - 7 x weekly - 3 sets - 8 reps - Heel Raises with Counter Support  - 1 x daily - 7 x weekly - 3 sets - 8 reps  - Standing Hip Extension with Leg Bent and Support  - 1 x daily - 7 x weekly - 2 sets - 10 reps - 5 seconds hold   GOALS: Goals reviewed with patient? No  SHORT TERM GOALS: Target date: 02/05/24  Pt will be independent with HEP to improve LE strength/endurance with gait and community level ADL's. Baseline: 01/08/24: Provided HEP Goal status: INITIAL   LONG TERM GOALS: Target date: 03/04/24  Pt will improve PSFS by at least 3 points  Baseline: 01/08/24: Deferred to next session; PSFS (patient specific functional scale) Golf 5, trimming shrubbery 4, walking 3 (score of 4 on average) (01/14/2024) Goal status:  INITIAL  2.  Pt will improve 5xSTS to 14 seconds or less to demonstrate clinically significant improvement in LE strength for age matched norms.  Baseline: 01/08/24: 23.15 sec; 12.82 seconds with B UE assist (01/29/2024) Goal status: MET  3.  Pt will improve 6 MWT by at least 165' to demonstrate clinically significant improvement in endurance with community ambulation tasks.  Baseline: 01/08/24: 1,068'; 1010 ft (01/31/2024) Goal status: ONGOING  4.  Pt will improve 10 meter self selected gait speed to at least 1.0 m/s to demonstrate clinically significant improvement for reduced falls risk with community ambulation distances.  Baseline: 01/08/24: .78 m/s Goal status: INITIAL  ASSESSMENT:  CLINICAL IMPRESSION:    Session focused on BLE strengthening as well as incorporating dynamic balance activities. Demonstrates balance deficits with step overs and would benefit from incorporating balance challenges in future sessions. Pt will benefit from continued skilled physical therapy services to improve strength, endurance and return to prior level of function.     OBJECTIVE IMPAIRMENTS: Abnormal gait, decreased activity tolerance, decreased mobility, difficulty walking, decreased strength, and postural dysfunction.   ACTIVITY LIMITATIONS: standing and locomotion level  PARTICIPATION LIMITATIONS: community activity, yard work, and golf  PERSONAL FACTORS: Age, Fitness, and Time since onset of injury/illness/exacerbation are also affecting patient's functional outcome.   REHAB POTENTIAL: Good  CLINICAL DECISION MAKING: Stable/uncomplicated  EVALUATION COMPLEXITY: Low  PLAN:  PT FREQUENCY: 1-2x/week  PT DURATION: 8 weeks  PLANNED INTERVENTIONS: 97164- PT Re-evaluation, 97750- Physical Performance Testing, 97110-Therapeutic exercises, 97530- Therapeutic activity, [id number]- Neuromuscular re-education, 97535- Self Care, [id number]- Manual therapy, [phone]- Gait training, Patient/Family education, Balance  training, Stair training, DME instructions, Cryotherapy, and Moist heat  PLAN FOR NEXT SESSION: Review HEP. Look at hip abduction strength in side lying. Screen hip extension AROM in prone. General LE strength/endurance.    Maryanne Finder, PT, DPT Physical Therapist - [facility] 02/07/2024, 4:42 PM

## 2024-02-12 ENCOUNTER — Ambulatory Visit: Attending: Internal Medicine

## 2024-02-12 DIAGNOSIS — R269 Unspecified abnormalities of gait and mobility: Secondary | ICD-10-CM | POA: Diagnosis not present

## 2024-02-12 DIAGNOSIS — M6281 Muscle weakness (generalized): Secondary | ICD-10-CM | POA: Diagnosis not present

## 2024-02-12 NOTE — Therapy (Signed)
 OUTPATIENT PHYSICAL THERAPY TREATMENT   Patient Name: Dakota Parker MRN: [id number] DOB:01/11/1931, 89 y.o., male Today's Date: 02/12/2024   PCP: Cleotilde Oneil FALCON, MD REFERRING PROVIDER: Cleotilde Oneil FALCON, MD  END OF SESSION:  PT End of Session - 02/12/24 1435     Visit Number 10    Number of Visits 17    Date for PT Re-Evaluation 03/04/24    PT Start Time 1436    PT Stop Time 1519    PT Time Calculation (min) 43 min    Activity Tolerance Patient tolerated treatment well    Behavior During Therapy [facility] for tasks assessed/performed                   Past Medical History:  Diagnosis Date   Complication of anesthesia    Hypertension    Kidney stones    Past Surgical History:  Procedure Laterality Date   KNEE ARTHROSCOPY Left    KNEE ARTHROSCOPY Left 01/04/2015   Procedure: Left knee arthroscopy parital medial menisectomy, chondraplasty,;  Surgeon: Lynwood SHAUNNA Hue, MD;  Location: [facility] ORS;  Service: Orthopedics;  Laterality: Left;   There are no active problems to display for this patient.   ONSET DATE: September 11, 2023  REFERRING DIAG: R53.1 (ICD-10-CM) - Weakness  THERAPY DIAG:  Muscle weakness (generalized)  Abnormality of gait and mobility  Rationale for Evaluation and Treatment: Rehabilitation  SUBJECTIVE:                                                                                                                                                                                             SUBJECTIVE STATEMENT:  Patient with no new complaints this date.   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 Reports symptoms ongoing for many months before (summer of 2024) going to ED and then hospital admission. Now not so much SOB, but having weakness. Roughly a 10 minute tolerance for physical activity before needing to rest. Denies falls. Knees don't buckle, but reports need to have a seat. Has goals to  return to playing golf. Does use a RW in the middle of the night due to being groggy from sleep and doesn't want to fall.   Blood pressure is controlled per pt but has white coat sydrome.    No latex allergies.     PAIN:  Are you having pain? No  PRECAUTIONS: None  RED FLAGS: None   WEIGHT BEARING RESTRICTIONS: No  FALLS: Has patient fallen in last 6 months? No  LIVING ENVIRONMENT: Lives with: lives  alone Lives in: House/apartment Stairs: Yes: Internal: to attic steps; on right going up Has following equipment at home: Walker - 2 wheeled  PLOF: Independent  PATIENT GOALS: Return to playing golf  OBJECTIVE:  Note: Objective measures were completed at Evaluation unless otherwise noted.  VITALS: BP: 167/82 mm Hg HR: 102 BPM SPO2: 99 %  DIAGNOSTIC FINDINGS: N/A  COGNITION: Overall cognitive status: Within functional limits for tasks assessed   POSTURE: R lateral shift in standing  LOWER EXTREMITY ROM:    Notable limitations in hip extension and ankle PF with gait  LOWER EXTREMITY MMT:    MMT Right Eval Left Eval  Hip flexion 5 5  Hip extension    Hip abduction    Hip adduction    Hip internal rotation 4 4  Hip external rotation 5 5  Knee flexion 5 5  Knee extension 5 5  Ankle dorsiflexion 5 5  Ankle plantarflexion 0 reps single leg heel raise 0 reps single leg heel raise  Ankle inversion    Ankle eversion    (Blank rows = not tested)   TRANSFERS: Sit to stand: Modified independence  Assistive device utilized: None      GAIT: Pt with limited hip extension and ankle PF with limited foot clearance in swing phase bilaterally as pt fatigues with prolonged distance. Mildly crouched posture too.   FUNCTIONAL TESTS:  5 times sit to stand: 23.15 sec Timed up and go (TUG): 12.45 seconds 6 minute walk test: 1,068' 10 meter walk test: self selected: .78 m/s;  fast: 1.19 m/s  PATIENT SURVEYS:  Patient Specific Functional Scale: deferred to next  session                                                                                                                              TREATMENT DATE: 02/12/24   Therapeutic activities Performed with the intent to improve function at home and ability to perform transfers and standing tasks with less difficulty.    Gait x 6 minutes   1085 ft no AD  Gait x 10 meters for 2 sets  10.25 seconds, 9.78 seconds (10.015 seconds average; 0.998 m/second)  Standing hip machine height 5               Hip extension                         R plate 55 for 89k7, then 7x. L knee discomfort during 3rd set                        L plate 55 for 89k6                 Hip abduction                         L plate 25 for 89k6  R plate 25 for 89k6   Sled push 30 ft with 35 lbs x 5   Standing ball toss to trampoline for balance while performing UE tasks 1 kg 20x2               Feet shoulder width apart                Does not feel out of breath afterwards    Improved exercise technique, movement at target joints, use of target muscles after mod verbal, visual, tactile cues.   PATIENT EDUCATION: Education details: HEP, POC Person educated: Patient Education method: [employer] Technology [employer] Education comprehension: verbalized understanding and needs further education  HOME EXERCISE PROGRAM: Access Code: [email or web address] URL: [email or web address] Date: 01/08/2024 Prepared by:  Exercises - Sit to Stand  - 1 x daily - 7 x weekly - 3 sets - 8 reps - Heel Raises with Counter Support  - 1 x daily - 7 x weekly - 3 sets - 8 reps  - Standing Hip Extension with Leg Bent and Support  - 1 x daily - 7 x weekly - 2 sets - 10 reps - 5 seconds hold   GOALS: Goals reviewed with patient? No  SHORT TERM GOALS: Target date: 02/05/24  Pt will be independent with HEP to improve LE strength/endurance with gait and community level ADL's. Baseline: 01/08/24: Provided HEP; no questions  with HEP (02/12/2024) Goal status: MET   LONG TERM GOALS: Target date: 03/04/24  Pt will improve PSFS by at least 3 points  Baseline: 01/08/24: Deferred to next session; PSFS (patient specific functional scale) Golf 5, trimming shrubbery 4, walking 3 (score of 4 on average) (01/14/2024); Golf 5, trimming shrubbery 4, walking 7 (score of 5.33 on average) (02/12/2024) Goal status: Progressing  2.  Pt will improve 5xSTS to 14 seconds or less to demonstrate clinically significant improvement in LE strength for age matched norms.  Baseline: 01/08/24: 23.15 sec; 12.82 seconds with B UE assist (01/29/2024) Goal status: MET  3.  Pt will improve 6 MWT by at least 165' to demonstrate clinically significant improvement in endurance with community ambulation tasks.  Baseline: 01/08/24: 1,068'; 1010 ft (01/31/2024); 1085 ft (02/12/2024) Goal status: ONGOING  4.  Pt will improve 10 meter self selected gait speed to at least 1.0 m/s to demonstrate clinically significant improvement for reduced falls risk with community ambulation distances.  Baseline: 01/08/24: .78 m/s; 0.998 m/s (02/12/2024) Goal status: Progressing/potentially met  ASSESSMENT:  CLINICAL IMPRESSION:     Pt demonstrates improved ability to ambulate longer distance in 6 minutes, improved gait speed, improved functional LE strength and improved function since initial evaluation. Pt making progress with PT towards goals. Continued working on improving B glute med, max, and quadriceps strength as well as improving activity tolerance to promote ability to perform tasks at home such as working on his shrubbery with less fatigue. Pt tolerated session well without aggravation of symptoms. Pt will benefit from continued skilled physical therapy services to improve strength, endurance and return to prior level of function.        OBJECTIVE IMPAIRMENTS: Abnormal gait, decreased activity tolerance, decreased mobility, difficulty walking, decreased strength,  and postural dysfunction.   ACTIVITY LIMITATIONS: standing and locomotion level  PARTICIPATION LIMITATIONS: community activity, yard work, and golf  PERSONAL FACTORS: Age, Fitness, and Time since onset of injury/illness/exacerbation are also affecting patient's functional outcome.   REHAB POTENTIAL: Good  CLINICAL DECISION MAKING: Stable/uncomplicated  EVALUATION COMPLEXITY: Low  PLAN:  PT FREQUENCY:  1-2x/week  PT DURATION: 8 weeks  PLANNED INTERVENTIONS: 97164- PT Re-evaluation, 97750- Physical Performance Testing, 97110-Therapeutic exercises, 97530- Therapeutic activity, [id number]- Neuromuscular re-education, 97535- Self Care, [id number]- Manual therapy, [phone]- Gait training, Patient/Family education, Balance training, Stair training, DME instructions, Cryotherapy, and Moist heat  PLAN FOR NEXT SESSION: Review HEP. Look at hip abduction strength in side lying. Screen hip extension AROM in prone. General LE strength/endurance.   Emil Glassman PT, DPT Physical Therapist - [facility] 02/12/2024, 4:21 PM

## 2024-02-13 DIAGNOSIS — I10 Essential (primary) hypertension: Secondary | ICD-10-CM | POA: Diagnosis not present

## 2024-02-13 DIAGNOSIS — I160 Hypertensive urgency: Secondary | ICD-10-CM | POA: Diagnosis not present

## 2024-02-14 ENCOUNTER — Ambulatory Visit

## 2024-02-14 DIAGNOSIS — R269 Unspecified abnormalities of gait and mobility: Secondary | ICD-10-CM

## 2024-02-14 DIAGNOSIS — M6281 Muscle weakness (generalized): Secondary | ICD-10-CM

## 2024-02-14 NOTE — Therapy (Signed)
 OUTPATIENT PHYSICAL THERAPY TREATMENT   Patient Name: Dakota Parker MRN: [id number] DOB:[birth year], 89 y.o., male Today's Date: 02/14/2024   PCP: Cleotilde Oneil FALCON, MD REFERRING PROVIDER: Cleotilde Oneil FALCON, MD  END OF SESSION:  PT End of Session - 02/14/24 1435     Visit Number 11    Number of Visits 17    Date for PT Re-Evaluation 03/04/24    PT Start Time 1437    PT Stop Time 1516    PT Time Calculation (min) 39 min    Activity Tolerance Patient tolerated treatment well    Behavior During Therapy [facility] for tasks assessed/performed                    Past Medical History:  Diagnosis Date   Complication of anesthesia    Hypertension    Kidney stones    Past Surgical History:  Procedure Laterality Date   KNEE ARTHROSCOPY Left    KNEE ARTHROSCOPY Left 01/04/2015   Procedure: Left knee arthroscopy parital medial menisectomy, chondraplasty,;  Surgeon: Lynwood SHAUNNA Hue, MD;  Location: [facility] ORS;  Service: Orthopedics;  Laterality: Left;   There are no active problems to display for this patient.   ONSET DATE: September 11, 2023  REFERRING DIAG: R53.1 (ICD-10-CM) - Weakness  THERAPY DIAG:  Muscle weakness (generalized)  Abnormality of gait and mobility  Rationale for Evaluation and Treatment: Rehabilitation  SUBJECTIVE:                                                                                                                                                                                             SUBJECTIVE STATEMENT:  Doing ok. Feels like he is getting a little more endurance. L knee feels better.   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 Reports symptoms ongoing for many months before (summer of 2024) going to ED and then hospital admission. Now not so much SOB, but having weakness. Roughly a 10 minute tolerance for physical activity before needing to rest. Denies falls. Knees don't buckle, but  reports need to have a seat. Has goals to return to playing golf. Does use a RW in the middle of the night due to being groggy from sleep and doesn't want to fall.   Blood pressure is controlled per pt but has white coat sydrome.    No latex allergies.     PAIN:  Are you having pain? No  PRECAUTIONS: None  RED FLAGS: None   WEIGHT BEARING RESTRICTIONS: No  FALLS: Has patient fallen in last  6 months? No  LIVING ENVIRONMENT: Lives with: lives alone Lives in: House/apartment Stairs: Yes: Internal: to attic steps; on right going up Has following equipment at home: Walker - 2 wheeled  PLOF: Independent  PATIENT GOALS: Return to playing golf  OBJECTIVE:  Note: Objective measures were completed at Evaluation unless otherwise noted.  VITALS: BP: 167/82 mm Hg HR: 102 BPM SPO2: 99 %  DIAGNOSTIC FINDINGS: N/A  COGNITION: Overall cognitive status: Within functional limits for tasks assessed   POSTURE: R lateral shift in standing  LOWER EXTREMITY ROM:    Notable limitations in hip extension and ankle PF with gait  LOWER EXTREMITY MMT:    MMT Right Eval Left Eval  Hip flexion 5 5  Hip extension    Hip abduction    Hip adduction    Hip internal rotation 4 4  Hip external rotation 5 5  Knee flexion 5 5  Knee extension 5 5  Ankle dorsiflexion 5 5  Ankle plantarflexion 0 reps single leg heel raise 0 reps single leg heel raise  Ankle inversion    Ankle eversion    (Blank rows = not tested)   TRANSFERS: Sit to stand: Modified independence  Assistive device utilized: None      GAIT: Pt with limited hip extension and ankle PF with limited foot clearance in swing phase bilaterally as pt fatigues with prolonged distance. Mildly crouched posture too.   FUNCTIONAL TESTS:  5 times sit to stand: 23.15 sec Timed up and go (TUG): 12.45 seconds 6 minute walk test: 1,068' 10 meter walk test: self selected: .78 m/s;  fast: 1.19 m/s  PATIENT SURVEYS:  Patient Specific  Functional Scale: deferred to next session                                                                                                                              TREATMENT DATE: 02/14/24   Therapeutic activities Performed with the intent to improve function at home and ability to perform transfers and standing tasks with less difficulty.   NuStep seat 9 at level 4 LE's only for 5 minutes                Able to maintain 90+ SPM   OMEGA                Leg extension with eccentric return for strengthening                        Seat 5                                   R plate 15 for 89k6                                   L  plate 15 for 89k6                                     Improved number of sets compared to previous sessions.     Sled push 30 ft with 35 lbs x 6  Standing hip machine height 5               Hip extension                         L plate 55 for 89k6                        R plate 55 for 89k6                 Hip abduction                         L plate 25 for 89k6                        R plate 25 for 89k6   Standing ball toss to trampoline for balance while performing UE tasks 1 kg 20x               Feet shoulder width apart         Improved exercise technique, movement at target joints, use of target muscles after mod verbal, visual, tactile cues.   PATIENT EDUCATION: Education details: HEP, POC Person educated: Patient Education method: Chief Technology Officer Education comprehension: verbalized understanding and needs further education  HOME EXERCISE PROGRAM: Access Code: [email or web address] URL: [email or web address] Date: 01/08/2024 Prepared by:  Exercises - Sit to Stand  - 1 x daily - 7 x weekly - 3 sets - 8 reps - Heel Raises with Counter Support  - 1 x daily - 7 x weekly - 3 sets - 8 reps  - Standing Hip Extension with Leg Bent and Support  - 1 x daily - 7 x weekly - 2 sets - 10 reps - 5 seconds hold   GOALS: Goals reviewed  with patient? No  SHORT TERM GOALS: Target date: 02/05/24  Pt will be independent with HEP to improve LE strength/endurance with gait and community level ADL's. Baseline: 01/08/24: Provided HEP; no questions with HEP (02/12/2024) Goal status: MET   LONG TERM GOALS: Target date: 03/04/24  Pt will improve PSFS by at least 3 points  Baseline: 01/08/24: Deferred to next session; PSFS (patient specific functional scale) Golf 5, trimming shrubbery 4, walking 3 (score of 4 on average) (01/14/2024); Golf 5, trimming shrubbery 4, walking 7 (score of 5.33 on average) (02/12/2024) Goal status: Progressing  2.  Pt will improve 5xSTS to 14 seconds or less to demonstrate clinically significant improvement in LE strength for age matched norms.  Baseline: 01/08/24: 23.15 sec; 12.82 seconds with B UE assist (01/29/2024) Goal status: MET  3.  Pt will improve 6 MWT by at least 165' to demonstrate clinically significant improvement in endurance with community ambulation tasks.  Baseline: 01/08/24: 1,068'; 1010 ft (01/31/2024); 1085 ft (02/12/2024) Goal status: ONGOING  4.  Pt will improve 10 meter self selected gait speed to at least 1.0 m/s to demonstrate clinically significant improvement for reduced falls risk with community ambulation distances.  Baseline: 01/08/24: .78 m/s; 0.998 m/s (02/12/2024) Goal  status: Progressing/potentially met  ASSESSMENT:  CLINICAL IMPRESSION:   Improved L knee comfort level based on subjective reports. Improving B quadriceps strength with increased number of sets added with leg extension exercise.  Continued working on improving B glute med, max, and quadriceps strength as well as improving activity tolerance to promote ability to perform tasks at home such as working on his shrubbery with less fatigue. Pt tolerated session well without aggravation of symptoms. Pt will benefit from continued skilled physical therapy services to improve strength, endurance and return to prior level of  function.        OBJECTIVE IMPAIRMENTS: Abnormal gait, decreased activity tolerance, decreased mobility, difficulty walking, decreased strength, and postural dysfunction.   ACTIVITY LIMITATIONS: standing and locomotion level  PARTICIPATION LIMITATIONS: community activity, yard work, and golf  PERSONAL FACTORS: Age, Fitness, and Time since onset of injury/illness/exacerbation are also affecting patient's functional outcome.   REHAB POTENTIAL: Good  CLINICAL DECISION MAKING: Stable/uncomplicated  EVALUATION COMPLEXITY: Low  PLAN:  PT FREQUENCY: 1-2x/week  PT DURATION: 8 weeks  PLANNED INTERVENTIONS: 97164- PT Re-evaluation, 97750- Physical Performance Testing, 97110-Therapeutic exercises, 97530- Therapeutic activity, [id number]- Neuromuscular re-education, 97535- Self Care, [id number]- Manual therapy, [phone]- Gait training, Patient/Family education, Balance training, Stair training, DME instructions, Cryotherapy, and Moist heat  PLAN FOR NEXT SESSION: Review HEP. Look at hip abduction strength in side lying. Screen hip extension AROM in prone. General LE strength/endurance.   Emil Glassman PT, DPT Physical Therapist - [facility] 02/14/2024, 4:50 PM

## 2024-02-18 ENCOUNTER — Ambulatory Visit

## 2024-02-18 DIAGNOSIS — R269 Unspecified abnormalities of gait and mobility: Secondary | ICD-10-CM

## 2024-02-18 DIAGNOSIS — M6281 Muscle weakness (generalized): Secondary | ICD-10-CM | POA: Diagnosis not present

## 2024-02-18 NOTE — Therapy (Signed)
 OUTPATIENT PHYSICAL THERAPY TREATMENT   Patient Name: Dakota Parker MRN: [id number] DOB:1930-07-01, 89 y.o., male Today's Date: 02/18/2024   PCP: Cleotilde Oneil FALCON, MD REFERRING PROVIDER: Cleotilde Oneil FALCON, MD  END OF SESSION:  PT End of Session - 02/18/24 1344     Visit Number 12    Number of Visits 17    Date for PT Re-Evaluation 03/04/24    PT Start Time 1344    PT Stop Time 1425    PT Time Calculation (min) 41 min    Activity Tolerance Patient tolerated treatment well    Behavior During Therapy [facility] for tasks assessed/performed                     Past Medical History:  Diagnosis Date   Complication of anesthesia    Hypertension    Kidney stones    Past Surgical History:  Procedure Laterality Date   KNEE ARTHROSCOPY Left    KNEE ARTHROSCOPY Left 01/04/2015   Procedure: Left knee arthroscopy parital medial menisectomy, chondraplasty,;  Surgeon: Lynwood SHAUNNA Hue, MD;  Location: [facility] ORS;  Service: Orthopedics;  Laterality: Left;   There are no active problems to display for this patient.   ONSET DATE: September 11, 2023  REFERRING DIAG: R53.1 (ICD-10-CM) - Weakness  THERAPY DIAG:  Muscle weakness (generalized)  Abnormality of gait and mobility  Rationale for Evaluation and Treatment: Rehabilitation  SUBJECTIVE:                                                                                                                                                                                             SUBJECTIVE STATEMENT:  Doing alright.   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 Reports symptoms ongoing for many months before (summer of 2024) going to ED and then hospital admission. Now not so much SOB, but having weakness. Roughly a 10 minute tolerance for physical activity before needing to rest. Denies falls. Knees don't buckle, but reports need to have a seat. Has goals to return to playing golf.  Does use a RW in the middle of the night due to being groggy from sleep and doesn't want to fall.   Blood pressure is controlled per pt but has white coat sydrome.    No latex allergies.     PAIN:  Are you having pain? No  PRECAUTIONS: None  RED FLAGS: None   WEIGHT BEARING RESTRICTIONS: No  FALLS: Has patient fallen in last 6 months? No  LIVING ENVIRONMENT: Lives with: lives alone Lives in:  House/apartment Stairs: Yes: Internal: to attic steps; on right going up Has following equipment at home: Walker - 2 wheeled  PLOF: Independent  PATIENT GOALS: Return to playing golf  OBJECTIVE:  Note: Objective measures were completed at Evaluation unless otherwise noted.  VITALS: BP: 167/82 mm Hg HR: 102 BPM SPO2: 99 %  DIAGNOSTIC FINDINGS: N/A  COGNITION: Overall cognitive status: Within functional limits for tasks assessed   POSTURE: R lateral shift in standing  LOWER EXTREMITY ROM:    Notable limitations in hip extension and ankle PF with gait  LOWER EXTREMITY MMT:    MMT Right Eval Left Eval  Hip flexion 5 5  Hip extension    Hip abduction    Hip adduction    Hip internal rotation 4 4  Hip external rotation 5 5  Knee flexion 5 5  Knee extension 5 5  Ankle dorsiflexion 5 5  Ankle plantarflexion 0 reps single leg heel raise 0 reps single leg heel raise  Ankle inversion    Ankle eversion    (Blank rows = not tested)   TRANSFERS: Sit to stand: Modified independence  Assistive device utilized: None      GAIT: Pt with limited hip extension and ankle PF with limited foot clearance in swing phase bilaterally as pt fatigues with prolonged distance. Mildly crouched posture too.   FUNCTIONAL TESTS:  5 times sit to stand: 23.15 sec Timed up and go (TUG): 12.45 seconds 6 minute walk test: 1,068' 10 meter walk test: self selected: .78 m/s;  fast: 1.19 m/s  PATIENT SURVEYS:  Patient Specific Functional Scale: deferred to next session                                                                                                                               TREATMENT DATE: 02/18/24   Therapeutic activities Performed with the intent to improve function at home and ability to perform transfers and standing tasks with less difficulty.   NuStep seat 9 at level 4 LE's only for 5 minutes                Able to maintain 90+ SPM  Standing with B UE assist   Hip abduction with yellow band around ankles   R 10x3   L 10x3  Sled push 30 ft with 35 lbs x 6  OMEGA                Leg extension with eccentric return for strengthening                        Seat 5                                   R plate 15 for 89k6  L plate 15 for 89k6  Standing hip machine height 5               Hip extension                         L plate 55 for 89k6                        R plate 55 for 89k6                 Hip abduction                         L plate 40 for 5x3                        R plate 40 for 5x3    Increased resistance added today, decreased reps    Standing ball toss to trampoline for balance while performing UE tasks 1 kg 20x2               Feet shoulder width apart         Improved exercise technique, movement at target joints, use of target muscles after mod verbal, visual, tactile cues.   PATIENT EDUCATION: Education details: HEP, POC Person educated: Patient Education method: [employer] Technology [employer] Education comprehension: verbalized understanding and needs further education  HOME EXERCISE PROGRAM: Access Code: [email or web address] URL: [email or web address] Date: 01/08/2024 Prepared by:  Exercises - Sit to Stand  - 1 x daily - 7 x weekly - 3 sets - 8 reps - Heel Raises with Counter Support  - 1 x daily - 7 x weekly - 3 sets - 8 reps  - Standing Hip Extension with Leg Bent and Support  - 1 x daily - 7 x weekly - 2 sets - 10 reps - 5 seconds hold  - Standing Hip Abduction with  Resistance at Ankles and Counter Support  - 1 x daily - 7 x weekly - 3 sets - 10 reps Yellow band   GOALS: Goals reviewed with patient? No  SHORT TERM GOALS: Target date: 02/05/24  Pt will be independent with HEP to improve LE strength/endurance with gait and community level ADL's. Baseline: 01/08/24: Provided HEP; no questions with HEP (02/12/2024) Goal status: MET   LONG TERM GOALS: Target date: 03/04/24  Pt will improve PSFS by at least 3 points  Baseline: 01/08/24: Deferred to next session; PSFS (patient specific functional scale) Golf 5, trimming shrubbery 4, walking 3 (score of 4 on average) (01/14/2024); Golf 5, trimming shrubbery 4, walking 7 (score of 5.33 on average) (02/12/2024) Goal status: Progressing  2.  Pt will improve 5xSTS to 14 seconds or less to demonstrate clinically significant improvement in LE strength for age matched norms.  Baseline: 01/08/24: 23.15 sec; 12.82 seconds with B UE assist (01/29/2024) Goal status: MET  3.  Pt will improve 6 MWT by at least 165' to demonstrate clinically significant improvement in endurance with community ambulation tasks.  Baseline: 01/08/24: 1,068'; 1010 ft (01/31/2024); 1085 ft (02/12/2024) Goal status: ONGOING  4.  Pt will improve 10 meter self selected gait speed to at least 1.0 m/s to demonstrate clinically significant improvement for reduced falls risk with community ambulation distances.  Baseline: 01/08/24: .78 m/s; 0.998 m/s (02/12/2024) Goal status: Progressing/potentially met  ASSESSMENT:  CLINICAL IMPRESSION:    Improving strength observed. Increased weight with  hip abduction machine. Continued working on improving B glute med, max, and quadriceps strength as well as improving activity tolerance to promote ability to perform tasks at home such as working on his shrubbery with less fatigue. Good muscle use reported with exercises.  Pt tolerated session well without aggravation of symptoms. Pt will benefit from continued skilled  physical therapy services to improve strength, endurance and return to prior level of function.        OBJECTIVE IMPAIRMENTS: Abnormal gait, decreased activity tolerance, decreased mobility, difficulty walking, decreased strength, and postural dysfunction.   ACTIVITY LIMITATIONS: standing and locomotion level  PARTICIPATION LIMITATIONS: community activity, yard work, and golf  PERSONAL FACTORS: Age, Fitness, and Time since onset of injury/illness/exacerbation are also affecting patient's functional outcome.   REHAB POTENTIAL: Good  CLINICAL DECISION MAKING: Stable/uncomplicated  EVALUATION COMPLEXITY: Low  PLAN:  PT FREQUENCY: 1-2x/week  PT DURATION: 8 weeks  PLANNED INTERVENTIONS: 97164- PT Re-evaluation, 97750- Physical Performance Testing, 97110-Therapeutic exercises, 97530- Therapeutic activity, [id number]- Neuromuscular re-education, 97535- Self Care, [id number]- Manual therapy, [phone]- Gait training, Patient/Family education, Balance training, Stair training, DME instructions, Cryotherapy, and Moist heat  PLAN FOR NEXT SESSION: Review HEP. Look at hip abduction strength in side lying. Screen hip extension AROM in prone. General LE strength/endurance.   Emil Glassman PT, DPT Physical Therapist - [facility] 02/18/2024, 2:31 PM

## 2024-02-21 ENCOUNTER — Ambulatory Visit

## 2024-02-21 DIAGNOSIS — M6281 Muscle weakness (generalized): Secondary | ICD-10-CM | POA: Diagnosis not present

## 2024-02-21 DIAGNOSIS — R269 Unspecified abnormalities of gait and mobility: Secondary | ICD-10-CM

## 2024-02-21 NOTE — Therapy (Signed)
 OUTPATIENT PHYSICAL THERAPY TREATMENT   Patient Name: Dakota Parker MRN: [id number] DOB:[birth year], 89 y.o., male Today's Date: 02/21/2024   PCP: Cleotilde Oneil FALCON, MD REFERRING PROVIDER: Cleotilde Oneil FALCON, MD  END OF SESSION:  PT End of Session - 02/21/24 1347     Visit Number 13    Number of Visits 17    Date for PT Re-Evaluation 03/04/24    PT Start Time 1347    PT Stop Time 1427    PT Time Calculation (min) 40 min    Activity Tolerance Patient tolerated treatment well    Behavior During Therapy [facility] for tasks assessed/performed                      Past Medical History:  Diagnosis Date   Complication of anesthesia    Hypertension    Kidney stones    Past Surgical History:  Procedure Laterality Date   KNEE ARTHROSCOPY Left    KNEE ARTHROSCOPY Left 01/04/2015   Procedure: Left knee arthroscopy parital medial menisectomy, chondraplasty,;  Surgeon: Lynwood SHAUNNA Hue, MD;  Location: [facility] ORS;  Service: Orthopedics;  Laterality: Left;   There are no active problems to display for this patient.   ONSET DATE: September 11, 2023  REFERRING DIAG: R53.1 (ICD-10-CM) - Weakness  THERAPY DIAG:  Muscle weakness (generalized)  Abnormality of gait and mobility  Rationale for Evaluation and Treatment: Rehabilitation  SUBJECTIVE:                                                                                                                                                                                             SUBJECTIVE STATEMENT:  Doing alright. Feels like his endurance is getting a little better. 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 Reports symptoms ongoing for many months before (summer of 2024) going to ED and then hospital admission. Now not so much SOB, but having weakness. Roughly a 10 minute tolerance for physical activity before needing to rest. Denies falls.  Knees don't buckle, but reports need to have a seat. Has goals to return to playing golf. Does use a RW in the middle of the night due to being groggy from sleep and doesn't want to fall.   Blood pressure is controlled per pt but has white coat sydrome.    No latex allergies.     PAIN:  Are you having pain? No  PRECAUTIONS: None  RED FLAGS: None   WEIGHT BEARING RESTRICTIONS: No  FALLS:  Has patient fallen in last 6 months? No  LIVING ENVIRONMENT: Lives with: lives alone Lives in: House/apartment Stairs: Yes: Internal: to attic steps; on right going up Has following equipment at home: Walker - 2 wheeled  PLOF: Independent  PATIENT GOALS: Return to playing golf  OBJECTIVE:  Note: Objective measures were completed at Evaluation unless otherwise noted.  VITALS: BP: 167/82 mm Hg HR: 102 BPM SPO2: 99 %  DIAGNOSTIC FINDINGS: N/A  COGNITION: Overall cognitive status: Within functional limits for tasks assessed   POSTURE: R lateral shift in standing  LOWER EXTREMITY ROM:    Notable limitations in hip extension and ankle PF with gait  LOWER EXTREMITY MMT:    MMT Right Eval Left Eval  Hip flexion 5 5  Hip extension    Hip abduction    Hip adduction    Hip internal rotation 4 4  Hip external rotation 5 5  Knee flexion 5 5  Knee extension 5 5  Ankle dorsiflexion 5 5  Ankle plantarflexion 0 reps single leg heel raise 0 reps single leg heel raise  Ankle inversion    Ankle eversion    (Blank rows = not tested)   TRANSFERS: Sit to stand: Modified independence  Assistive device utilized: None      GAIT: Pt with limited hip extension and ankle PF with limited foot clearance in swing phase bilaterally as pt fatigues with prolonged distance. Mildly crouched posture too.   FUNCTIONAL TESTS:  5 times sit to stand: 23.15 sec Timed up and go (TUG): 12.45 seconds 6 minute walk test: 1,068' 10 meter walk test: self selected: .78 m/s;  fast: 1.19 m/s  PATIENT  SURVEYS:  Patient Specific Functional Scale: deferred to next session                                                                                                                              TREATMENT DATE: 02/21/24   Therapeutic activities Performed with the intent to improve function at home and ability to perform transfers and standing tasks with less difficulty.   Gait x 1200 ft, performed to promote endurance for standing tasks at home.   Therapeutic rest break provided afterwards for muscle recovery.    Blood pressure L arm sitting, normal cuff, mechanically taken 159/82, HR 92.   Standing with B UE assist   Hip abduction with yellow band around ankles   R 10x2   L 10x2  Sled push 30 ft with 35 lbs x 6   Standing hip machine height 5               Hip extension                         L plate 55 for 89k6                        R plate 55  for 10x3                 Hip abduction                         L plate 40 for 5x3                        R plate 40 for 5x3   Standing ball toss to trampoline for balance while performing UE tasks 1 kg 20x2               Feet shoulder width apart      Improved exercise technique, movement at target joints, use of target muscles after mod verbal, visual, tactile cues.   PATIENT EDUCATION: Education details: HEP, POC Person educated: Patient Education method: Chief Technology Officer Education comprehension: verbalized understanding and needs further education  HOME EXERCISE PROGRAM: Access Code: [email or web address] URL: [email or web address] Date: 01/08/2024 Prepared by:  Exercises - Sit to Stand  - 1 x daily - 7 x weekly - 3 sets - 8 reps - Heel Raises with Counter Support  - 1 x daily - 7 x weekly - 3 sets - 8 reps  - Standing Hip Extension with Leg Bent and Support  - 1 x daily - 7 x weekly - 2 sets - 10 reps - 5 seconds hold  - Standing Hip Abduction with Resistance at Ankles and Counter Support  - 1 x daily - 7  x weekly - 3 sets - 10 reps Yellow band   GOALS: Goals reviewed with patient? No  SHORT TERM GOALS: Target date: 02/05/24  Pt will be independent with HEP to improve LE strength/endurance with gait and community level ADL's. Baseline: 01/08/24: Provided HEP; no questions with HEP (02/12/2024) Goal status: MET   LONG TERM GOALS: Target date: 03/04/24  Pt will improve PSFS by at least 3 points  Baseline: 01/08/24: Deferred to next session; PSFS (patient specific functional scale) Golf 5, trimming shrubbery 4, walking 3 (score of 4 on average) (01/14/2024); Golf 5, trimming shrubbery 4, walking 7 (score of 5.33 on average) (02/12/2024) Goal status: Progressing  2.  Pt will improve 5xSTS to 14 seconds or less to demonstrate clinically significant improvement in LE strength for age matched norms.  Baseline: 01/08/24: 23.15 sec; 12.82 seconds with B UE assist (01/29/2024) Goal status: MET  3.  Pt will improve 6 MWT by at least 165' to demonstrate clinically significant improvement in endurance with community ambulation tasks.  Baseline: 01/08/24: 1,068'; 1010 ft (01/31/2024); 1085 ft (02/12/2024) Goal status: ONGOING  4.  Pt will improve 10 meter self selected gait speed to at least 1.0 m/s to demonstrate clinically significant improvement for reduced falls risk with community ambulation distances.  Baseline: 01/08/24: .78 m/s; 0.998 m/s (02/12/2024) Goal status: Progressing/potentially met  ASSESSMENT:  CLINICAL IMPRESSION:    Added gait for 1200 ft today to promote endurance while performing standing tasks. Continued working on improving B glute med, max, and quadriceps strength as well as improving activity tolerance to promote ability to perform tasks at home such as working on his shrubbery with less fatigue. Pt tolerated session well without aggravation of symptoms. Pt will benefit from continued skilled physical therapy services to improve strength, endurance and return to prior level of function.         OBJECTIVE IMPAIRMENTS: Abnormal gait, decreased activity tolerance, decreased mobility, difficulty walking, decreased strength, and postural dysfunction.  ACTIVITY LIMITATIONS: standing and locomotion level  PARTICIPATION LIMITATIONS: community activity, yard work, and golf  PERSONAL FACTORS: Age, Fitness, and Time since onset of injury/illness/exacerbation are also affecting patient's functional outcome.   REHAB POTENTIAL: Good  CLINICAL DECISION MAKING: Stable/uncomplicated  EVALUATION COMPLEXITY: Low  PLAN:  PT FREQUENCY: 1-2x/week  PT DURATION: 8 weeks  PLANNED INTERVENTIONS: 97164- PT Re-evaluation, 97750- Physical Performance Testing, 97110-Therapeutic exercises, 97530- Therapeutic activity, [id number]- Neuromuscular re-education, 97535- Self Care, [id number]- Manual therapy, [phone]- Gait training, Patient/Family education, Balance training, Stair training, DME instructions, Cryotherapy, and Moist heat  PLAN FOR NEXT SESSION: Review HEP. Look at hip abduction strength in side lying. Screen hip extension AROM in prone. General LE strength/endurance.   Emil Glassman PT, DPT Physical Therapist - [facility] 02/21/2024, 2:32 PM

## 2024-02-25 ENCOUNTER — Ambulatory Visit

## 2024-02-25 DIAGNOSIS — R269 Unspecified abnormalities of gait and mobility: Secondary | ICD-10-CM

## 2024-02-25 DIAGNOSIS — M6281 Muscle weakness (generalized): Secondary | ICD-10-CM | POA: Diagnosis not present

## 2024-02-25 NOTE — Therapy (Signed)
 OUTPATIENT PHYSICAL THERAPY TREATMENT   Patient Name: Dakota Parker MRN: [id number] DOB:15-Jun-1930, 89 y.o., male Today's Date: 02/25/2024   PCP: Cleotilde Oneil FALCON, MD REFERRING PROVIDER: Cleotilde Oneil FALCON, MD  END OF SESSION:  PT End of Session - 02/25/24 1355     Visit Number 14    Number of Visits 17    Date for PT Re-Evaluation 03/04/24    PT Start Time 1355    PT Stop Time 1433    PT Time Calculation (min) 38 min    Activity Tolerance Patient tolerated treatment well    Behavior During Therapy [facility] for tasks assessed/performed                       Past Medical History:  Diagnosis Date   Complication of anesthesia    Hypertension    Kidney stones    Past Surgical History:  Procedure Laterality Date   KNEE ARTHROSCOPY Left    KNEE ARTHROSCOPY Left 01/04/2015   Procedure: Left knee arthroscopy parital medial menisectomy, chondraplasty,;  Surgeon: Lynwood SHAUNNA Hue, MD;  Location: [facility] ORS;  Service: Orthopedics;  Laterality: Left;   There are no active problems to display for this patient.   ONSET DATE: September 11, 2023  REFERRING DIAG: R53.1 (ICD-10-CM) - Weakness  THERAPY DIAG:  Muscle weakness (generalized)  Abnormality of gait and mobility  Rationale for Evaluation and Treatment: Rehabilitation  SUBJECTIVE:                                                                                                                                                                                             SUBJECTIVE STATEMENT:  Doing ok.  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 Reports symptoms ongoing for many months before (summer of 2024) going to ED and then hospital admission. Now not so much SOB, but having weakness. Roughly a 10 minute tolerance for physical activity before needing to rest. Denies falls. Knees don't buckle, but reports need to have a seat. Has goals to return to playing golf.  Does use a RW in the middle of the night due to being groggy from sleep and doesn't want to fall.   Blood pressure is controlled per pt but has white coat sydrome.    No latex allergies.     PAIN:  Are you having pain? No  PRECAUTIONS: Blood pressure  RED FLAGS: None   WEIGHT BEARING RESTRICTIONS: No  FALLS: Has patient fallen in last 6 months? No  LIVING ENVIRONMENT: Lives with: lives alone  Lives in: House/apartment Stairs: Yes: Internal: to attic steps; on right going up Has following equipment at home: Walker - 2 wheeled  PLOF: Independent  PATIENT GOALS: Return to playing golf  OBJECTIVE:  Note: Objective measures were completed at Evaluation unless otherwise noted.  VITALS: BP: 167/82 mm Hg HR: 102 BPM SPO2: 99 %  DIAGNOSTIC FINDINGS: N/A  COGNITION: Overall cognitive status: Within functional limits for tasks assessed   POSTURE: R lateral shift in standing  LOWER EXTREMITY ROM:    Notable limitations in hip extension and ankle PF with gait  LOWER EXTREMITY MMT:    MMT Right Eval Left Eval  Hip flexion 5 5  Hip extension    Hip abduction    Hip adduction    Hip internal rotation 4 4  Hip external rotation 5 5  Knee flexion 5 5  Knee extension 5 5  Ankle dorsiflexion 5 5  Ankle plantarflexion 0 reps single leg heel raise 0 reps single leg heel raise  Ankle inversion    Ankle eversion    (Blank rows = not tested)   TRANSFERS: Sit to stand: Modified independence  Assistive device utilized: None      GAIT: Pt with limited hip extension and ankle PF with limited foot clearance in swing phase bilaterally as pt fatigues with prolonged distance. Mildly crouched posture too.   FUNCTIONAL TESTS:  5 times sit to stand: 23.15 sec Timed up and go (TUG): 12.45 seconds 6 minute walk test: 1,068' 10 meter walk test: self selected: .78 m/s;  fast: 1.19 m/s  PATIENT SURVEYS:  Patient Specific Functional Scale: deferred to next session                                                                                                                               TREATMENT DATE: 02/25/24   Therapeutic activities Performed with the intent to improve function at home and ability to perform transfers and standing tasks with less difficulty.   Gait x 1200 ft, performed to promote endurance for standing tasks at home.   Therapeutic rest break provided afterwards for muscle recovery.    Blood pressure L arm sitting, normal cuff, mechanically taken 191/85, HR 99.     asymptomatic   5 minutes rest break provided afterwards secondary to elevated systolic level.     Blood pressure L arm sitting, normal cuff, mechanically taken 165/85, HR 85.    Pt was recommended to contact his doctor pertaining to his medication.    Side stepping 30 ft to the R and 30 ft to the L 3x    Standing ball toss to trampoline for balance while performing UE tasks 1 kg 20x2               Feet shoulder width apart   Sit <> stand from regular chair with B UE assist to stand and no UE assist to sit 10x2  Standing with B UE assist  Hip abduction with yellow band around ankles   R 10x2   L 10x2    Improved exercise technique, movement at target joints, use of target muscles after mod verbal, visual, tactile cues.   PATIENT EDUCATION: Education details: HEP, POC Person educated: Patient Education method: Chief Technology Officer Education comprehension: verbalized understanding and needs further education  HOME EXERCISE PROGRAM: Access Code: [email or web address] URL: [email or web address] Date: 01/08/2024 Prepared by:  Exercises - Sit to Stand  - 1 x daily - 7 x weekly - 3 sets - 8 reps - Heel Raises with Counter Support  - 1 x daily - 7 x weekly - 3 sets - 8 reps  - Standing Hip Extension with Leg Bent and Support  - 1 x daily - 7 x weekly - 2 sets - 10 reps - 5 seconds hold  - Standing Hip Abduction with Resistance at Ankles and Counter Support   - 1 x daily - 7 x weekly - 3 sets - 10 reps Yellow band   GOALS: Goals reviewed with patient? No  SHORT TERM GOALS: Target date: 02/05/24  Pt will be independent with HEP to improve LE strength/endurance with gait and community level ADL's. Baseline: 01/08/24: Provided HEP; no questions with HEP (02/12/2024) Goal status: MET   LONG TERM GOALS: Target date: 03/04/24  Pt will improve PSFS by at least 3 points  Baseline: 01/08/24: Deferred to next session; PSFS (patient specific functional scale) Golf 5, trimming shrubbery 4, walking 3 (score of 4 on average) (01/14/2024); Golf 5, trimming shrubbery 4, walking 7 (score of 5.33 on average) (02/12/2024) Goal status: Progressing  2.  Pt will improve 5xSTS to 14 seconds or less to demonstrate clinically significant improvement in LE strength for age matched norms.  Baseline: 01/08/24: 23.15 sec; 12.82 seconds with B UE assist (01/29/2024) Goal status: MET  3.  Pt will improve 6 MWT by at least 165' to demonstrate clinically significant improvement in endurance with community ambulation tasks.  Baseline: 01/08/24: 1,068'; 1010 ft (01/31/2024); 1085 ft (02/12/2024) Goal status: ONGOING  4.  Pt will improve 10 meter self selected gait speed to at least 1.0 m/s to demonstrate clinically significant improvement for reduced falls risk with community ambulation distances.  Baseline: 01/08/24: .78 m/s; 0.998 m/s (02/12/2024) Goal status: Progressing/potentially met  ASSESSMENT:  CLINICAL IMPRESSION:   Lighter exercises with rest breaks performed secondary to elevated blood pressure levels. Pt was recommended to contact his doctor pertaining to his medication; pt verbalized understanding. Worked on gait endurance, standing balance and functional LE strength to promote ability to perform tasks at home such as working on his shrubbery with less fatigue. Pt tolerated session well without aggravation of symptoms. Pt will benefit from continued skilled physical therapy  services to improve strength, endurance and return to prior level of function.        OBJECTIVE IMPAIRMENTS: Abnormal gait, decreased activity tolerance, decreased mobility, difficulty walking, decreased strength, and postural dysfunction.   ACTIVITY LIMITATIONS: standing and locomotion level  PARTICIPATION LIMITATIONS: community activity, yard work, and golf  PERSONAL FACTORS: Age, Fitness, and Time since onset of injury/illness/exacerbation are also affecting patient's functional outcome.   REHAB POTENTIAL: Good  CLINICAL DECISION MAKING: Stable/uncomplicated  EVALUATION COMPLEXITY: Low  PLAN:  PT FREQUENCY: 1-2x/week  PT DURATION: 8 weeks  PLANNED INTERVENTIONS: 97164- PT Re-evaluation, 97750- Physical Performance Testing, 97110-Therapeutic exercises, 97530- Therapeutic activity, [id number]- Neuromuscular re-education, 97535- Self Care, [id number]- Manual therapy, [phone]- Gait training, Patient/Family education, Balance training, Stair training, DME instructions, Cryotherapy,  and Moist heat  PLAN FOR NEXT SESSION: Review HEP. Look at hip abduction strength in side lying. Screen hip extension AROM in prone. General LE strength/endurance.   Emil Glassman PT, DPT Physical Therapist - [facility] 02/25/2024, 4:55 PM

## 2024-02-28 ENCOUNTER — Ambulatory Visit

## 2024-02-28 DIAGNOSIS — M6281 Muscle weakness (generalized): Secondary | ICD-10-CM

## 2024-02-28 DIAGNOSIS — I129 Hypertensive chronic kidney disease with stage 1 through stage 4 chronic kidney disease, or unspecified chronic kidney disease: Secondary | ICD-10-CM | POA: Diagnosis not present

## 2024-02-28 DIAGNOSIS — E1122 Type 2 diabetes mellitus with diabetic chronic kidney disease: Secondary | ICD-10-CM | POA: Diagnosis not present

## 2024-02-28 DIAGNOSIS — N184 Chronic kidney disease, stage 4 (severe): Secondary | ICD-10-CM | POA: Diagnosis not present

## 2024-02-28 DIAGNOSIS — R269 Unspecified abnormalities of gait and mobility: Secondary | ICD-10-CM

## 2024-02-28 DIAGNOSIS — E875 Hyperkalemia: Secondary | ICD-10-CM | POA: Diagnosis not present

## 2024-02-28 NOTE — Therapy (Signed)
 OUTPATIENT PHYSICAL THERAPY TREATMENT   Patient Name: Dakota Parker MRN: [id number] DOB:10-31-1930, 89 y.o., male Today's Date: 02/28/2024   PCP: Cleotilde Oneil FALCON, MD REFERRING PROVIDER: Cleotilde Oneil FALCON, MD  END OF SESSION:  PT End of Session - 02/28/24 1301     Visit Number 15    Number of Visits 17    Date for Recertification  03/04/24    PT Start Time 1301    PT Stop Time 1330    PT Time Calculation (min) 29 min    Activity Tolerance Patient tolerated treatment well    Behavior During Therapy [facility] for tasks assessed/performed                        Past Medical History:  Diagnosis Date   Complication of anesthesia    Hypertension    Kidney stones    Past Surgical History:  Procedure Laterality Date   KNEE ARTHROSCOPY Left    KNEE ARTHROSCOPY Left 01/04/2015   Procedure: Left knee arthroscopy parital medial menisectomy, chondraplasty,;  Surgeon: Lynwood SHAUNNA Hue, MD;  Location: [facility] ORS;  Service: Orthopedics;  Laterality: Left;   There are no active problems to display for this patient.   ONSET DATE: September 11, 2023  REFERRING DIAG: R53.1 (ICD-10-CM) - Weakness  THERAPY DIAG:  Muscle weakness (generalized)  Abnormality of gait and mobility  Rationale for Evaluation and Treatment: Rehabilitation  SUBJECTIVE:                                                                                                                                                                                             SUBJECTIVE STATEMENT:  Doing ok. Blood pressure has been high since his doctor changed it. Daughter called his doctor about it. 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 Reports symptoms ongoing for many months before (summer of 2024) going to ED and then hospital admission. Now not so much SOB, but having weakness. Roughly a 10 minute tolerance for physical  activity before needing to rest. Denies falls. Knees don't buckle, but reports need to have a seat. Has goals to return to playing golf. Does use a RW in the middle of the night due to being groggy from sleep and doesn't want to fall.   Blood pressure is controlled per pt but has white coat sydrome.    No latex allergies.     PAIN:  Are you having pain? No  PRECAUTIONS: Blood pressure  RED FLAGS:  None   WEIGHT BEARING RESTRICTIONS: No  FALLS: Has patient fallen in last 6 months? No  LIVING ENVIRONMENT: Lives with: lives alone Lives in: House/apartment Stairs: Yes: Internal: to attic steps; on right going up Has following equipment at home: Walker - 2 wheeled  PLOF: Independent  PATIENT GOALS: Return to playing golf  OBJECTIVE:  Note: Objective measures were completed at Evaluation unless otherwise noted.  VITALS: BP: 167/82 mm Hg HR: 102 BPM SPO2: 99 %  DIAGNOSTIC FINDINGS: N/A  COGNITION: Overall cognitive status: Within functional limits for tasks assessed   POSTURE: R lateral shift in standing  LOWER EXTREMITY ROM:    Notable limitations in hip extension and ankle PF with gait  LOWER EXTREMITY MMT:    MMT Right Eval Left Eval  Hip flexion 5 5  Hip extension    Hip abduction    Hip adduction    Hip internal rotation 4 4  Hip external rotation 5 5  Knee flexion 5 5  Knee extension 5 5  Ankle dorsiflexion 5 5  Ankle plantarflexion 0 reps single leg heel raise 0 reps single leg heel raise  Ankle inversion    Ankle eversion    (Blank rows = not tested)   TRANSFERS: Sit to stand: Modified independence  Assistive device utilized: None      GAIT: Pt with limited hip extension and ankle PF with limited foot clearance in swing phase bilaterally as pt fatigues with prolonged distance. Mildly crouched posture too.   FUNCTIONAL TESTS:  5 times sit to stand: 23.15 sec Timed up and go (TUG): 12.45 seconds 6 minute walk test: 1,068' 10 meter walk  test: self selected: .78 m/s;  fast: 1.19 m/s  PATIENT SURVEYS:  Patient Specific Functional Scale: deferred to next session                                                                                                                              TREATMENT DATE: 02/28/24   Therapeutic activities Performed with the intent to improve endurance for function at home and ability to perform transfers and standing tasks with less difficulty.   At start of session: Blood pressure L arm sitting, normal cuff, mechanically taken 191/86, HR 79  Asymptomatic  5 minute rest break provided.    Blood pressure L arm sitting, normal cuff, mechanically taken 165/79 , HR 62  Gait x 1200 ft, performed to promote endurance for standing tasks at home.   Feels out of breath a little afterwards  Blood pressure L arm sitting, normal cuff, mechanically taken 208/82, HR 106   Therapeutic rest break provided afterwards for muscle recovery.    5 minutes rest break provided afterwards secondary to elevated systolic level.     Blood pressure L arm sitting, normal cuff, mechanically taken 164/83, HR 84      Pt was recommended to contact his doctor again pertaining to his blood pressure medication. Pt verbalized understanding.  Session ended earlier today secondary to systolic levels surpassing 200 today after ambulation.     Improved exercise technique, movement at target joints, use of target muscles after mod verbal, visual, tactile cues.   PATIENT EDUCATION: Education details: HEP, POC Person educated: Patient Education method: Chief Technology Officer Education comprehension: verbalized understanding and needs further education  HOME EXERCISE PROGRAM: Access Code: [email or web address] URL: [email or web address] Date: 01/08/2024 Prepared by:  Exercises - Sit to Stand  - 1 x daily - 7 x weekly - 3 sets - 8 reps - Heel Raises with Counter Support  - 1 x daily - 7 x weekly - 3 sets - 8  reps  - Standing Hip Extension with Leg Bent and Support  - 1 x daily - 7 x weekly - 2 sets - 10 reps - 5 seconds hold  - Standing Hip Abduction with Resistance at Ankles and Counter Support  - 1 x daily - 7 x weekly - 3 sets - 10 reps Yellow band   GOALS: Goals reviewed with patient? No  SHORT TERM GOALS: Target date: 02/05/24  Pt will be independent with HEP to improve LE strength/endurance with gait and community level ADL's. Baseline: 01/08/24: Provided HEP; no questions with HEP (02/12/2024) Goal status: MET   LONG TERM GOALS: Target date: 03/04/24  Pt will improve PSFS by at least 3 points  Baseline: 01/08/24: Deferred to next session; PSFS (patient specific functional scale) Golf 5, trimming shrubbery 4, walking 3 (score of 4 on average) (01/14/2024); Golf 5, trimming shrubbery 4, walking 7 (score of 5.33 on average) (02/12/2024) Goal status: Progressing  2.  Pt will improve 5xSTS to 14 seconds or less to demonstrate clinically significant improvement in LE strength for age matched norms.  Baseline: 01/08/24: 23.15 sec; 12.82 seconds with B UE assist (01/29/2024) Goal status: MET  3.  Pt will improve 6 MWT by at least 165' to demonstrate clinically significant improvement in endurance with community ambulation tasks.  Baseline: 01/08/24: 1,068'; 1010 ft (01/31/2024); 1085 ft (02/12/2024) Goal status: ONGOING  4.  Pt will improve 10 meter self selected gait speed to at least 1.0 m/s to demonstrate clinically significant improvement for reduced falls risk with community ambulation distances.  Baseline: 01/08/24: .78 m/s; 0.998 m/s (02/12/2024) Goal status: Progressing/potentially met  ASSESSMENT:  CLINICAL IMPRESSION:    Session ended earlier today secondary to systolic levels surpassing 200 mmHg today after ambulation. Pt was recommended to monitor his BP levels and to cancel PT if it is too hight when he checks it at home. Pt was also recommended to contact his Pt verbalized  understanding. Pt tolerated session well without aggravation of symptoms. Pt will benefit from continued skilled physical therapy services to improve strength, endurance, and function.         OBJECTIVE IMPAIRMENTS: Abnormal gait, decreased activity tolerance, decreased mobility, difficulty walking, decreased strength, and postural dysfunction.   ACTIVITY LIMITATIONS: standing and locomotion level  PARTICIPATION LIMITATIONS: community activity, yard work, and golf  PERSONAL FACTORS: Age, Fitness, and Time since onset of injury/illness/exacerbation are also affecting patient's functional outcome.   REHAB POTENTIAL: Good  CLINICAL DECISION MAKING: Stable/uncomplicated  EVALUATION COMPLEXITY: Low  PLAN:  PT FREQUENCY: 1-2x/week  PT DURATION: 8 weeks  PLANNED INTERVENTIONS: 97164- PT Re-evaluation, 97750- Physical Performance Testing, 97110-Therapeutic exercises, 97530- Therapeutic activity, [id number]- Neuromuscular re-education, 97535- Self Care, 02859- Manual therapy, [phone]- Gait training, Patient/Family education, Balance training, Stair training, DME instructions, Cryotherapy, and Moist heat  PLAN FOR NEXT SESSION: Review HEP.  Look at hip abduction strength in side lying. Screen hip extension AROM in prone. General LE strength/endurance.   Emil Glassman PT, DPT Physical Therapist - [facility]  [facility] 02/28/2024, 1:40 PM

## 2024-02-29 DIAGNOSIS — Z87891 Personal history of nicotine dependence: Secondary | ICD-10-CM | POA: Diagnosis not present

## 2024-02-29 DIAGNOSIS — R001 Bradycardia, unspecified: Secondary | ICD-10-CM | POA: Diagnosis not present

## 2024-02-29 DIAGNOSIS — J439 Emphysema, unspecified: Secondary | ICD-10-CM | POA: Diagnosis not present

## 2024-02-29 DIAGNOSIS — N184 Chronic kidney disease, stage 4 (severe): Secondary | ICD-10-CM | POA: Diagnosis not present

## 2024-02-29 DIAGNOSIS — I120 Hypertensive chronic kidney disease with stage 5 chronic kidney disease or end stage renal disease: Secondary | ICD-10-CM | POA: Diagnosis not present

## 2024-02-29 DIAGNOSIS — E1122 Type 2 diabetes mellitus with diabetic chronic kidney disease: Secondary | ICD-10-CM | POA: Diagnosis not present

## 2024-02-29 DIAGNOSIS — R079 Chest pain, unspecified: Secondary | ICD-10-CM | POA: Diagnosis not present

## 2024-02-29 DIAGNOSIS — Z7901 Long term (current) use of anticoagulants: Secondary | ICD-10-CM | POA: Diagnosis not present

## 2024-02-29 DIAGNOSIS — I129 Hypertensive chronic kidney disease with stage 1 through stage 4 chronic kidney disease, or unspecified chronic kidney disease: Secondary | ICD-10-CM | POA: Diagnosis not present

## 2024-03-01 DIAGNOSIS — R079 Chest pain, unspecified: Secondary | ICD-10-CM | POA: Diagnosis not present

## 2024-03-01 DIAGNOSIS — I10 Essential (primary) hypertension: Secondary | ICD-10-CM | POA: Diagnosis not present

## 2024-03-03 ENCOUNTER — Ambulatory Visit

## 2024-03-03 DIAGNOSIS — M6281 Muscle weakness (generalized): Secondary | ICD-10-CM

## 2024-03-03 DIAGNOSIS — R269 Unspecified abnormalities of gait and mobility: Secondary | ICD-10-CM

## 2024-03-03 NOTE — Therapy (Signed)
 OUTPATIENT PHYSICAL THERAPY TREATMENT   Patient Name: Dakota Parker MRN: [id number] DOB:May 04, 1931, 89 y.o., male Today's Date: 03/03/2024   PCP: Cleotilde Oneil FALCON, MD REFERRING PROVIDER: Cleotilde Oneil FALCON, MD  END OF SESSION:  PT End of Session - 03/03/24 1349     Visit Number 16    Number of Visits 17    Date for Recertification  03/04/24    PT Start Time 1349    PT Stop Time 1427    PT Time Calculation (min) 38 min    Activity Tolerance Patient tolerated treatment well    Behavior During Therapy [facility] for tasks assessed/performed                         Past Medical History:  Diagnosis Date   Complication of anesthesia    Hypertension    Kidney stones    Past Surgical History:  Procedure Laterality Date   KNEE ARTHROSCOPY Left    KNEE ARTHROSCOPY Left 01/04/2015   Procedure: Left knee arthroscopy parital medial menisectomy, chondraplasty,;  Surgeon: Lynwood SHAUNNA Hue, MD;  Location: [facility] ORS;  Service: Orthopedics;  Laterality: Left;   There are no active problems to display for this patient.   ONSET DATE: September 11, 2023  REFERRING DIAG: R53.1 (ICD-10-CM) - Weakness  THERAPY DIAG:  Muscle weakness (generalized)  Abnormality of gait and mobility  Rationale for Evaluation and Treatment: Rehabilitation  SUBJECTIVE:                                                                                                                                                                                             SUBJECTIVE STATEMENT:  Spent Friday night at the hospital trying to get his blood pressure under control. His blood pressure medicine was adjusted. Was 143/78 this morning when he checked it. 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 Reports symptoms ongoing for many months before (summer of 2024) going to ED and then hospital admission. Now  not so much SOB, but having weakness. Roughly a 10 minute tolerance for physical activity before needing to rest. Denies falls. Knees don't buckle, but reports need to have a seat. Has goals to return to playing golf. Does use a RW in the middle of the night due to being groggy from sleep and doesn't want to fall.   Blood pressure is controlled per pt but has white coat sydrome.    No latex allergies.  PAIN:  Are you having pain? No  PRECAUTIONS: Blood pressure  RED FLAGS: None   WEIGHT BEARING RESTRICTIONS: No  FALLS: Has patient fallen in last 6 months? No  LIVING ENVIRONMENT: Lives with: lives alone Lives in: House/apartment Stairs: Yes: Internal: to attic steps; on right going up Has following equipment at home: Walker - 2 wheeled  PLOF: Independent  PATIENT GOALS: Return to playing golf  OBJECTIVE:  Note: Objective measures were completed at Evaluation unless otherwise noted.  VITALS: BP: 167/82 mm Hg HR: 102 BPM SPO2: 99 %  DIAGNOSTIC FINDINGS: N/A  COGNITION: Overall cognitive status: Within functional limits for tasks assessed   POSTURE: R lateral shift in standing  LOWER EXTREMITY ROM:    Notable limitations in hip extension and ankle PF with gait  LOWER EXTREMITY MMT:    MMT Right Eval Left Eval  Hip flexion 5 5  Hip extension    Hip abduction    Hip adduction    Hip internal rotation 4 4  Hip external rotation 5 5  Knee flexion 5 5  Knee extension 5 5  Ankle dorsiflexion 5 5  Ankle plantarflexion 0 reps single leg heel raise 0 reps single leg heel raise  Ankle inversion    Ankle eversion    (Blank rows = not tested)   TRANSFERS: Sit to stand: Modified independence  Assistive device utilized: None      GAIT: Pt with limited hip extension and ankle PF with limited foot clearance in swing phase bilaterally as pt fatigues with prolonged distance. Mildly crouched posture too.   FUNCTIONAL TESTS:  5 times sit to stand: 23.15  sec Timed up and go (TUG): 12.45 seconds 6 minute walk test: 1,068' 10 meter walk test: self selected: .78 m/s;  fast: 1.19 m/s  PATIENT SURVEYS:  Patient Specific Functional Scale: deferred to next session                                                                                                                              TREATMENT DATE: 03/03/24   Therapeutic activities Performed with the intent to improve endurance for function at home and ability to perform transfers and standing tasks with less difficulty.   At start of session: Blood pressure L arm sitting, normal cuff, mechanically taken 169/84, HR 77  Asymptomatic  After 2 minutes of rest:    Blood pressure L arm sitting, normal cuff, mechanically taken 162/76 , HR 63   Light exercises performed  Standing B UE assist   Hip abduction    R 10x5 seconds for 2 sets   L 10x5 seconds for 2 sets    Blood pressure L arm sitting, normal cuff, mechanically taken 163/90 , HR 82   Hip extension    R 10x5 seconds for 2 sets   L 10x5 seconds for 2 sets    Blood pressure L arm sitting, normal cuff, mechanically taken 155/82 , HR 89  Side stepping 30 ft to the R and 30 ft to the L 3x  To promote glute med strengthening      Improved exercise technique, movement at target joints, use of target muscles after mod verbal, visual, tactile cues.   PATIENT EDUCATION: Education details: HEP, POC Person educated: Patient Education method: Chief Technology Officer Education comprehension: verbalized understanding and needs further education  HOME EXERCISE PROGRAM: Access Code: [email or web address] URL: [email or web address] Date: 01/08/2024 Prepared by:  Exercises - Sit to Stand  - 1 x daily - 7 x weekly - 3 sets - 8 reps - Heel Raises with Counter Support  - 1 x daily - 7 x weekly - 3 sets - 8 reps  - Standing Hip Extension with Leg Bent and Support  - 1 x daily - 7 x weekly - 2 sets - 10 reps - 5 seconds  hold  - Standing Hip Abduction with Resistance at Ankles and Counter Support  - 1 x daily - 7 x weekly - 3 sets - 10 reps Yellow band   GOALS: Goals reviewed with patient? No  SHORT TERM GOALS: Target date: 02/05/24  Pt will be independent with HEP to improve LE strength/endurance with gait and community level ADL's. Baseline: 01/08/24: Provided HEP; no questions with HEP (02/12/2024) Goal status: MET   LONG TERM GOALS: Target date: 03/04/24  Pt will improve PSFS by at least 3 points  Baseline: 01/08/24: Deferred to next session; PSFS (patient specific functional scale) Golf 5, trimming shrubbery 4, walking 3 (score of 4 on average) (01/14/2024); Golf 5, trimming shrubbery 4, walking 7 (score of 5.33 on average) (02/12/2024) Goal status: Progressing  2.  Pt will improve 5xSTS to 14 seconds or less to demonstrate clinically significant improvement in LE strength for age matched norms.  Baseline: 01/08/24: 23.15 sec; 12.82 seconds with B UE assist (01/29/2024) Goal status: MET  3.  Pt will improve 6 MWT by at least 165' to demonstrate clinically significant improvement in endurance with community ambulation tasks.  Baseline: 01/08/24: 1,068'; 1010 ft (01/31/2024); 1085 ft (02/12/2024) Goal status: ONGOING  4.  Pt will improve 10 meter self selected gait speed to at least 1.0 m/s to demonstrate clinically significant improvement for reduced falls risk with community ambulation distances.  Baseline: 01/08/24: .78 m/s; 0.998 m/s (02/12/2024) Goal status: Progressing/potentially met  ASSESSMENT:  CLINICAL IMPRESSION:   Light session performed while monitoring blood pressure levels. Continued working on glute med and max muscle strengthening to promote ability to perform standing tasks with less difficulty. Pt tolerated session well without aggravation of symptoms. Pt will benefit from continued skilled physical therapy services to improve strength, endurance, and function.         OBJECTIVE  IMPAIRMENTS: Abnormal gait, decreased activity tolerance, decreased mobility, difficulty walking, decreased strength, and postural dysfunction.   ACTIVITY LIMITATIONS: standing and locomotion level  PARTICIPATION LIMITATIONS: community activity, yard work, and golf  PERSONAL FACTORS: Age, Fitness, and Time since onset of injury/illness/exacerbation are also affecting patient's functional outcome.   REHAB POTENTIAL: Good  CLINICAL DECISION MAKING: Stable/uncomplicated  EVALUATION COMPLEXITY: Low  PLAN:  PT FREQUENCY: 1-2x/week  PT DURATION: 8 weeks  PLANNED INTERVENTIONS: 97164- PT Re-evaluation, 97750- Physical Performance Testing, 97110-Therapeutic exercises, 97530- Therapeutic activity, [id number]- Neuromuscular re-education, 97535- Self Care, [id number]- Manual therapy, [phone]- Gait training, Patient/Family education, Balance training, Stair training, DME instructions, Cryotherapy, and Moist heat  PLAN FOR NEXT SESSION: Review HEP. Look at hip abduction strength in side lying. Screen hip extension AROM in  prone. General LE strength/endurance.   Emil Glassman PT, DPT Physical Therapist - [facility]  [facility] 03/03/2024, 4:39 PM

## 2024-03-06 ENCOUNTER — Ambulatory Visit

## 2024-03-06 DIAGNOSIS — I10 Essential (primary) hypertension: Secondary | ICD-10-CM | POA: Diagnosis not present

## 2024-03-06 DIAGNOSIS — E875 Hyperkalemia: Secondary | ICD-10-CM | POA: Diagnosis not present

## 2024-03-06 DIAGNOSIS — N1832 Chronic kidney disease, stage 3b: Secondary | ICD-10-CM | POA: Diagnosis not present

## 2024-03-10 ENCOUNTER — Ambulatory Visit

## 2024-03-10 DIAGNOSIS — R269 Unspecified abnormalities of gait and mobility: Secondary | ICD-10-CM

## 2024-03-10 DIAGNOSIS — M6281 Muscle weakness (generalized): Secondary | ICD-10-CM | POA: Diagnosis not present

## 2024-03-10 NOTE — Therapy (Signed)
 OUTPATIENT PHYSICAL THERAPY TREATMENT Re-certification for today's session And Discharge Summary   Patient Name: Dakota Parker MRN: [id number] DOB:[birth year], 89 y.o., male Today's Date: 03/10/2024   PCP: Cleotilde Oneil FALCON, MD REFERRING PROVIDER: Cleotilde Oneil FALCON, MD  END OF SESSION:  PT End of Session - 03/10/24 1351     Visit Number 17    Number of Visits 17    Date for Recertification  03/04/24    PT Start Time 1351    PT Stop Time 1431    PT Time Calculation (min) 40 min    Activity Tolerance Patient tolerated treatment well    Behavior During Therapy [facility] for tasks assessed/performed                          Past Medical History:  Diagnosis Date   Complication of anesthesia    Hypertension    Kidney stones    Past Surgical History:  Procedure Laterality Date   KNEE ARTHROSCOPY Left    KNEE ARTHROSCOPY Left 01/04/2015   Procedure: Left knee arthroscopy parital medial menisectomy, chondraplasty,;  Surgeon: Lynwood SHAUNNA Hue, MD;  Location: [facility] ORS;  Service: Orthopedics;  Laterality: Left;   There are no active problems to display for this patient.   ONSET DATE: September 11, 2023  REFERRING DIAG: R53.1 (ICD-10-CM) - Weakness  THERAPY DIAG:  Muscle weakness (generalized) - Plan: PT plan of care cert/re-cert  Abnormality of gait and mobility - Plan: PT plan of care cert/re-cert  Rationale for Evaluation and Treatment: Rehabilitation  SUBJECTIVE:                                                                                                                                                                                             SUBJECTIVE STATEMENT:  Feels like he can stop PT today and continue with his HEP.     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 Reports symptoms ongoing for many months before (summer of 2024) going to ED and then hospital admission. Now not so much SOB, but  having weakness. Roughly a 10 minute tolerance for physical activity before needing to rest. Denies falls. Knees don't buckle, but reports need to have a seat. Has goals to return to playing golf. Does use a RW in the middle of the night due to being groggy from sleep and doesn't want to fall.   Blood pressure is controlled per pt but has white coat sydrome.    No latex allergies.     PAIN:  Are you having pain? No  PRECAUTIONS: Blood pressure  RED FLAGS: None   WEIGHT BEARING RESTRICTIONS: No  FALLS: Has patient fallen in last 6 months? No  LIVING ENVIRONMENT: Lives with: lives alone Lives in: House/apartment Stairs: Yes: Internal: to attic steps; on right going up Has following equipment at home: Walker - 2 wheeled  PLOF: Independent  PATIENT GOALS: Return to playing golf  OBJECTIVE:  Note: Objective measures were completed at Evaluation unless otherwise noted.  VITALS: BP: 167/82 mm Hg HR: 102 BPM SPO2: 99 %  DIAGNOSTIC FINDINGS: N/A  COGNITION: Overall cognitive status: Within functional limits for tasks assessed   POSTURE: R lateral shift in standing  LOWER EXTREMITY ROM:    Notable limitations in hip extension and ankle PF with gait  LOWER EXTREMITY MMT:    MMT Right Eval Left Eval  Hip flexion 5 5  Hip extension    Hip abduction    Hip adduction    Hip internal rotation 4 4  Hip external rotation 5 5  Knee flexion 5 5  Knee extension 5 5  Ankle dorsiflexion 5 5  Ankle plantarflexion 0 reps single leg heel raise 0 reps single leg heel raise  Ankle inversion    Ankle eversion    (Blank rows = not tested)   TRANSFERS: Sit to stand: Modified independence  Assistive device utilized: None      GAIT: Pt with limited hip extension and ankle PF with limited foot clearance in swing phase bilaterally as pt fatigues with prolonged distance. Mildly crouched posture too.   FUNCTIONAL TESTS:  5 times sit to stand: 23.15 sec Timed up and go (TUG):  12.45 seconds 6 minute walk test: 1,068' 10 meter walk test: self selected: .78 m/s;  fast: 1.19 m/s  PATIENT SURVEYS:  Patient Specific Functional Scale: deferred to next session                                                                                                                              TREATMENT DATE: 03/10/24   Therapeutic activities Performed with the intent to improve endurance for function at home and ability to perform transfers and standing tasks with less difficulty.   At start of session: Blood pressure L arm sitting, normal cuff, mechanically taken 160/76, HR 83  Asymptomatic   Standing B UE assist   Hip abduction    R 10x5 seconds for 2 sets   L 10x5 seconds for 2 sets  Swinging a golf club 6x  CGA  Pt able to perform without LOB. Medium difficulty per pt.    Standing with B UE assist    Hip extension    R 10x5 seconds for 2 sets   L 10x5 seconds for 2 sets  Gait 2x 10 meters  First: 9.25 seconds Second: 8.38 seconds  Average: 8.82 seconds average; 1.13 m/s    Standing with 25 lb barbel on land mine attachement  Tilting  bar side to side from R <> L 8x5 seconds each side  Performed to promote ability to use his hedge trimmer with less difficulty.         Improved exercise technique, movement at target joints, use of target muscles after mod verbal, visual, tactile cues.   PATIENT EDUCATION: Education details: HEP, POC Person educated: Patient Education method: Chief Technology Officer Education comprehension: verbalized understanding and needs further education  HOME EXERCISE PROGRAM: Access Code: [email or web address] URL: [email or web address] Date: 01/08/2024 Prepared by:  Exercises - Sit to Stand  - 1 x daily - 7 x weekly - 3 sets - 8 reps - Heel Raises with Counter Support  - 1 x daily - 7 x weekly - 3 sets - 8 reps  - Standing Hip Extension with Leg Bent and Support  - 1 x daily - 7 x weekly - 2 sets - 10 reps - 5  seconds hold  - Standing Hip Abduction with Resistance at Ankles and Counter Support  - 1 x daily - 7 x weekly - 3 sets - 10 reps Yellow band   GOALS: Goals reviewed with patient? No  SHORT TERM GOALS: Target date: 02/05/24  Pt will be independent with HEP to improve LE strength/endurance with gait and community level ADL's. Baseline: 01/08/24: Provided HEP; no questions with HEP (02/12/2024) Goal status: MET   LONG TERM GOALS: Target date: 03/10/24  Pt will improve PSFS by at least 3 points  Baseline: 01/08/24: Deferred to next session; PSFS (patient specific functional scale) Golf 5, trimming shrubbery 4, walking 3 (score of 4 on average) (01/14/2024); Golf 5, trimming shrubbery 4, walking 7 (score of 5.33 on average) (02/12/2024); Golf 5 , trimming shrubbery 5, walking 5  (score of 5 average) (03/10/2024) Goal status: Progressing  2.  Pt will improve 5xSTS to 14 seconds or less to demonstrate clinically significant improvement in LE strength for age matched norms.  Baseline: 01/08/24: 23.15 sec; 12.82 seconds with B UE assist (01/29/2024) Goal status: MET  3.  Pt will improve 6 MWT by at least 165' to demonstrate clinically significant improvement in endurance with community ambulation tasks.  Baseline: 01/08/24: 1,068'; 1010 ft (01/31/2024); 1085 ft (02/12/2024); not performed secondary to elevated blood pressure levels (03/10/2024) Goal status: ONGOING  4.  Pt will improve 10 meter self selected gait speed to at least 1.0 m/s to demonstrate clinically significant improvement for reduced falls risk with community ambulation distances.  Baseline: 01/08/24: .78 m/s; 0.998 m/s (02/12/2024); 1.13 m/s (03/10/2024) Goal status: MET  ASSESSMENT:  CLINICAL IMPRESSION:    Pt demonstrates improved functional LE strength, gait speed, and ability to ambulate as well as slight improvement in patient perceived functional scale score since initial evaluation. Pt has made progress with PT towards goals.  Challenges to progress include age and elevated blood pressure levels. Skilled physical therapy services discharged with pt continuing his progress with his exercises at home after recert for today's visit.         OBJECTIVE IMPAIRMENTS: Abnormal gait, decreased activity tolerance, decreased mobility, difficulty walking, decreased strength, and postural dysfunction.   ACTIVITY LIMITATIONS: standing and locomotion level  PARTICIPATION LIMITATIONS: community activity, yard work, and golf  PERSONAL FACTORS: Age, Fitness, and Time since onset of injury/illness/exacerbation are also affecting patient's functional outcome.   REHAB POTENTIAL: Good  CLINICAL DECISION MAKING: Stable/uncomplicated  EVALUATION COMPLEXITY: Low  PLAN:  PT FREQUENCY: one time visit  PT DURATION: other: today's visit  PLANNED INTERVENTIONS: 97164- PT Re-evaluation, 97750- Physical  Performance Testing, 97110-Therapeutic exercises, 97530- Therapeutic activity, [id number]- Neuromuscular re-education, [id number]- Self Care, [id number]- Manual therapy, [phone]- Gait training, Patient/Family education, Balance training, Stair training, DME instructions, Cryotherapy, and Moist heat  PLAN FOR NEXT SESSION: Review HEP. Look at hip abduction strength in side lying. Screen hip extension AROM in prone. General LE strength/endurance.    Thank you for your referral.   Emil Glassman PT, DPT Physical Therapist - [facility]  [facility] 03/10/2024, 4:32 PM

## 2024-03-13 ENCOUNTER — Ambulatory Visit

## 2024-03-20 DIAGNOSIS — N1832 Chronic kidney disease, stage 3b: Secondary | ICD-10-CM | POA: Diagnosis not present

## 2024-03-20 DIAGNOSIS — I10 Essential (primary) hypertension: Secondary | ICD-10-CM | POA: Diagnosis not present

## 2024-04-09 DIAGNOSIS — I10 Essential (primary) hypertension: Secondary | ICD-10-CM | POA: Diagnosis not present

## 2024-04-09 DIAGNOSIS — N1832 Chronic kidney disease, stage 3b: Secondary | ICD-10-CM | POA: Diagnosis not present

## 2024-05-20 DIAGNOSIS — H401122 Primary open-angle glaucoma, left eye, moderate stage: Secondary | ICD-10-CM | POA: Diagnosis not present

## 2024-05-20 DIAGNOSIS — H25042 Posterior subcapsular polar age-related cataract, left eye: Secondary | ICD-10-CM | POA: Diagnosis not present

## 2024-05-20 DIAGNOSIS — H25041 Posterior subcapsular polar age-related cataract, right eye: Secondary | ICD-10-CM | POA: Diagnosis not present

## 2024-05-20 DIAGNOSIS — H401113 Primary open-angle glaucoma, right eye, severe stage: Secondary | ICD-10-CM | POA: Diagnosis not present

## 2024-05-20 DIAGNOSIS — H5203 Hypermetropia, bilateral: Secondary | ICD-10-CM | POA: Diagnosis not present

## 2024-05-20 DIAGNOSIS — H25013 Cortical age-related cataract, bilateral: Secondary | ICD-10-CM | POA: Diagnosis not present

## 2024-05-20 DIAGNOSIS — H52223 Regular astigmatism, bilateral: Secondary | ICD-10-CM | POA: Diagnosis not present

## 2024-05-20 DIAGNOSIS — H2513 Age-related nuclear cataract, bilateral: Secondary | ICD-10-CM | POA: Diagnosis not present

## 2024-05-21 DIAGNOSIS — M1712 Unilateral primary osteoarthritis, left knee: Secondary | ICD-10-CM | POA: Diagnosis not present

## 2024-05-21 DIAGNOSIS — N184 Chronic kidney disease, stage 4 (severe): Secondary | ICD-10-CM | POA: Diagnosis not present

## 2024-05-21 DIAGNOSIS — N1832 Chronic kidney disease, stage 3b: Secondary | ICD-10-CM | POA: Diagnosis not present

## 2024-05-21 DIAGNOSIS — E1122 Type 2 diabetes mellitus with diabetic chronic kidney disease: Secondary | ICD-10-CM | POA: Diagnosis not present

## 2024-06-26 ENCOUNTER — Ambulatory Visit: Admitting: Dermatology

## 2024-06-26 ENCOUNTER — Encounter: Payer: Self-pay | Admitting: Dermatology

## 2024-06-26 DIAGNOSIS — L814 Other melanin hyperpigmentation: Secondary | ICD-10-CM | POA: Diagnosis not present

## 2024-06-26 DIAGNOSIS — D692 Other nonthrombocytopenic purpura: Secondary | ICD-10-CM

## 2024-06-26 DIAGNOSIS — L570 Actinic keratosis: Secondary | ICD-10-CM

## 2024-06-26 DIAGNOSIS — Z85828 Personal history of other malignant neoplasm of skin: Secondary | ICD-10-CM

## 2024-06-26 DIAGNOSIS — Z1283 Encounter for screening for malignant neoplasm of skin: Secondary | ICD-10-CM | POA: Diagnosis not present

## 2024-06-26 DIAGNOSIS — C44319 Basal cell carcinoma of skin of other parts of face: Secondary | ICD-10-CM

## 2024-06-26 DIAGNOSIS — B351 Tinea unguium: Secondary | ICD-10-CM

## 2024-06-26 DIAGNOSIS — D485 Neoplasm of uncertain behavior of skin: Secondary | ICD-10-CM

## 2024-06-26 DIAGNOSIS — D1801 Hemangioma of skin and subcutaneous tissue: Secondary | ICD-10-CM | POA: Diagnosis not present

## 2024-06-26 DIAGNOSIS — L821 Other seborrheic keratosis: Secondary | ICD-10-CM

## 2024-06-26 DIAGNOSIS — D229 Melanocytic nevi, unspecified: Secondary | ICD-10-CM

## 2024-06-26 DIAGNOSIS — L578 Other skin changes due to chronic exposure to nonionizing radiation: Secondary | ICD-10-CM

## 2024-06-26 DIAGNOSIS — C4491 Basal cell carcinoma of skin, unspecified: Secondary | ICD-10-CM

## 2024-06-26 DIAGNOSIS — C44311 Basal cell carcinoma of skin of nose: Secondary | ICD-10-CM | POA: Diagnosis not present

## 2024-06-26 DIAGNOSIS — D0462 Carcinoma in situ of skin of left upper limb, including shoulder: Secondary | ICD-10-CM | POA: Diagnosis not present

## 2024-06-26 DIAGNOSIS — W908XXA Exposure to other nonionizing radiation, initial encounter: Secondary | ICD-10-CM

## 2024-06-26 DIAGNOSIS — B353 Tinea pedis: Secondary | ICD-10-CM

## 2024-06-26 DIAGNOSIS — I10 Essential (primary) hypertension: Secondary | ICD-10-CM | POA: Insufficient documentation

## 2024-06-26 DIAGNOSIS — C4492 Squamous cell carcinoma of skin, unspecified: Secondary | ICD-10-CM

## 2024-06-26 HISTORY — DX: Squamous cell carcinoma of skin, unspecified: C44.92

## 2024-06-26 HISTORY — DX: Basal cell carcinoma of skin, unspecified: C44.91

## 2024-06-26 NOTE — Progress Notes (Signed)
 "  New Patient Visit   Subjective  Dakota Parker is a 89 y.o. male who presents for the following: Skin Cancer Screening and Full Body Skin Exam  The patient presents for Total-Body Skin Exam (TBSE) for skin cancer screening and mole check. The patient has spots, moles and lesions to be evaluated, some may be new or changing and the patient may have concern these could be cancer.  Patient was going to [address] in [place] but they are no longer in network for him. Patient with hx of multiple BCC, AK's, rosacea but not treating.   Patient accompanied by daughter who contributes to history.  The following portions of the chart were reviewed this encounter and updated as appropriate: medications, allergies, medical history  Review of Systems:  No other skin or systemic complaints except as noted in HPI or Assessment and Plan.  Objective  Well appearing patient in no apparent distress; mood and affect are within normal limits.  A full examination was performed including scalp, head, eyes, ears, nose, lips, neck, chest, axillae, abdomen, back, buttocks, bilateral upper extremities, bilateral lower extremities, hands, feet, fingers, toes, fingernails, and toenails. All findings within normal limits unless otherwise noted below.   Relevant physical exam findings are noted in the Assessment and Plan.  right superior forehead 1.1 cm pink plaque with telangiectasias  left mid cheek 8 mm white papule with telangiectasias  right nasal sidewall 6 mm white papule with telangiectasias  Left mid Forearm 1.3 cm hyperkeratotic plaque   left mid helix 4 mm pink papule with telangiectasias   Assessment & Plan   SKIN CANCER SCREENING PERFORMED TODAY.  ACTINIC DAMAGE - Chronic condition, secondary to cumulative UV/sun exposure - diffuse scaly erythematous macules with underlying dyspigmentation - Recommend daily broad spectrum sunscreen SPF 30+ to sun-exposed areas, reapply  every 2 hours as needed.  - Staying in the shade or wearing long sleeves, sun glasses (UVA+UVB protection) and wide brim hats (4-inch brim around the entire circumference of the hat) are also recommended for sun protection.  - Call for new or changing lesions.  LENTIGINES, SEBORRHEIC KERATOSES, HEMANGIOMAS - Benign normal skin lesions - Benign-appearing - Call for any changes  MELANOCYTIC NEVI - Tan-brown and/or pink-flesh-colored symmetric macules and papules - Benign appearing on exam today - Observation - Call clinic for new or changing moles - Recommend daily use of broad spectrum spf 30+ sunscreen to sun-exposed areas.   HISTORY OF BASAL CELL CARCINOMA OF THE SKIN - No evidence of recurrence today - Recommend regular full body skin exams - Recommend daily broad spectrum sunscreen SPF 30+ to sun-exposed areas, reapply every 2 hours as needed.  - Call if any new or changing lesions are noted between office visits - multiple, treated in [facility], some with Dr. Gregorio for Mohs  TINEA PEDIS and onychomycosis Exam: red scaly plaques on plantar feet. Discoloration, subungual debris of toenails  Treatment Plan: Not bothersome for patient NEOPLASM OF UNCERTAIN BEHAVIOR OF SKIN (5) right superior forehead - Skin / nail biopsy Type of biopsy: tangential   Informed consent: discussed and consent obtained   Timeout: patient name, date of birth, surgical site, and procedure verified   Procedure prep:  Patient was prepped and draped in usual sterile fashion Prep type:  Isopropyl alcohol Anesthesia: the lesion was anesthetized in a standard fashion   Anesthetic:  1% lidocaine  w/ epinephrine  1-100,000 buffered w/ 8.4% NaHCO3 Instrument used: DermaBlade   Hemostasis achieved with: pressure and  aluminum chloride   Outcome: patient tolerated procedure well   Post-procedure details: sterile dressing applied and wound care instructions given   Dressing type: bandage and petrolatum     Specimen 1 - Surgical pathology Differential Diagnosis: BCC  Check Margins: No left mid cheek - Skin / nail biopsy Type of biopsy: tangential   Informed consent: discussed and consent obtained   Timeout: patient name, date of birth, surgical site, and procedure verified   Procedure prep:  Patient was prepped and draped in usual sterile fashion Prep type:  Isopropyl alcohol Anesthesia: the lesion was anesthetized in a standard fashion   Anesthetic:  1% lidocaine  w/ epinephrine  1-100,000 buffered w/ 8.4% NaHCO3 Instrument used: DermaBlade   Hemostasis achieved with: pressure and aluminum chloride   Outcome: patient tolerated procedure well   Post-procedure details: sterile dressing applied and wound care instructions given   Dressing type: bandage and petrolatum    Specimen 2 - Surgical pathology Differential Diagnosis: BCC  Check Margins: No right nasal sidewall - Skin / nail biopsy Type of biopsy: tangential   Informed consent: discussed and consent obtained   Timeout: patient name, date of birth, surgical site, and procedure verified   Procedure prep:  Patient was prepped and draped in usual sterile fashion Prep type:  Isopropyl alcohol Anesthesia: the lesion was anesthetized in a standard fashion   Anesthetic:  1% lidocaine  w/ epinephrine  1-100,000 buffered w/ 8.4% NaHCO3 Instrument used: DermaBlade   Hemostasis achieved with: pressure and aluminum chloride   Outcome: patient tolerated procedure well   Post-procedure details: sterile dressing applied and wound care instructions given   Dressing type: bandage and petrolatum    Specimen 3 - Surgical pathology Differential Diagnosis: BCC  Check Margins: No Left mid Forearm - Skin / nail biopsy Type of biopsy: tangential   Informed consent: discussed and consent obtained   Timeout: patient name, date of birth, surgical site, and procedure verified   Procedure prep:  Patient was prepped and draped in usual sterile  fashion Prep type:  Isopropyl alcohol Anesthesia: the lesion was anesthetized in a standard fashion   Anesthetic:  1% lidocaine  w/ epinephrine  1-100,000 buffered w/ 8.4% NaHCO3 Instrument used: DermaBlade   Hemostasis achieved with: pressure and aluminum chloride   Outcome: patient tolerated procedure well   Post-procedure details: sterile dressing applied and wound care instructions given   Dressing type: bandage and petrolatum    Specimen 4 - Surgical pathology Differential Diagnosis: SCC  Check Margins: No left mid helix Will monitor left mid helix and right forearm Discussed that facial lesions will require Mohs surgery. Patient agrees to biopsy and Mohs if needed  Patient's daughter present for biopsies MULTIPLE BENIGN NEVI   LENTIGINES   ACTINIC ELASTOSIS   SEBORRHEIC KERATOSES   CHERRY ANGIOMA   ACTINIC KERATOSES   SOLAR PURPURA   TINEA PEDIS OF BOTH FEET   ONYCHOMYCOSIS    Return in about 2 months (around 08/24/2024) for with Dr. Claudene.  LILLETTE Lonell Drones, RMA, am acting as scribe for Boneta Claudene, MD .   Documentation: I have reviewed the above documentation for accuracy and completeness, and I agree with the above.  Boneta Claudene, MD    "

## 2024-06-26 NOTE — Patient Instructions (Addendum)
 Biopsy Wound Care Instructions  Leave the original bandage on for 24 hours if possible.  If the bandage becomes soaked or soiled before that time, it is OK to remove it and examine the wound.  A small amount of post-operative bleeding is normal.  If excessive bleeding occurs, remove the bandage, place gauze over the site and apply continuous pressure (no peeking) over the area for 30 minutes. If this does not work, please call our clinic as soon as possible or page your doctor if it is after hours.   Once a day, cleanse the wound with soap and water. It is fine to shower. After washing, apply petroleum jelly (Vaseline) or an antibiotic ointment if your doctor prescribed one for you, followed by a bandage.    For best healing, the wound should be covered with a layer of ointment at all times. If you are not able to keep the area covered with a bandage to hold the ointment in place, this may mean re-applying the ointment several times a day.  Continue this wound care until the wound has healed and is no longer open.   Itching and mild discomfort is normal during the healing process. However, if you develop pain or severe itching, please call our office.   If you have any discomfort, you can take Tylenol  (acetaminophen ) or ibuprofen as directed on the bottle. (Please do not take these if you have an allergy to them or cannot take them for another reason).  Some redness, tenderness and white or yellow material in the wound is normal healing.  If the area becomes very sore and red, or develops a thick yellow-green material (pus), it may be infected; please notify us .    If you have stitches, return to clinic as directed to have the stitches removed. You will continue wound care for 2-3 days after the stitches are removed.   Wound healing continues for up to one year following surgery. It is not unusual to experience pain in the scar from time to time during the interval.  If the pain becomes severe or the  scar thickens, you should notify the office.    A slight amount of redness in a scar is expected for the first six months.  After six months, the redness will fade and the scar will soften and fade.  The color difference becomes less noticeable with time.  If there are any problems, return for a post-op surgery check at your earliest convenience.  To improve the appearance of the scar, you can use silicone scar gel, cream, or sheets (such as Mederma or Serica) every night for up to one year. These are available over the counter (without a prescription).  Please call our office at [phone] for any questions or concerns.  Melanoma ABCDEs  Melanoma is the most dangerous type of skin cancer, and is the leading cause of death from skin disease.  You are more likely to develop melanoma if you: Have light-colored skin, light-colored eyes, or red or blond hair Spend a lot of time in the sun Tan regularly, either outdoors or in a tanning bed Have had blistering sunburns, especially during childhood Have a close family member who has had a melanoma Have atypical moles or large birthmarks  Early detection of melanoma is key since treatment is typically straightforward and cure rates are extremely high if we catch it early.   The first sign of melanoma is often a change in a mole or a new dark  spot.  The ABCDE system is a way of remembering the signs of melanoma.  A for asymmetry:  The two halves do not match. B for border:  The edges of the growth are irregular. C for color:  A mixture of colors are present instead of an even brown color. D for diameter:  Melanomas are usually (but not always) greater than 6mm - the size of a pencil eraser. E for evolution:  The spot keeps changing in size, shape, and color.  Please check your skin once per month between visits. You can use a small mirror in front and a large mirror behind you to keep an eye on the back side or your body.   If you see any new  or changing lesions before your next follow-up, please call to schedule a visit.  Please continue daily skin protection including broad spectrum sunscreen SPF 30+ to sun-exposed areas, reapplying every 2 hours as needed when you're outdoors.    Due to recent changes in healthcare laws, you may see results of your pathology and/or laboratory studies on MyChart before the doctors have had a chance to review them. We understand that in some cases there may be results that are confusing or concerning to you. Please understand that not all results are received at the same time and often the doctors may need to interpret multiple results in order to provide you with the best plan of care or course of treatment. Therefore, we ask that you please give us  2 business days to thoroughly review all your results before contacting the office for clarification. Should we see a critical lab result, you will be contacted sooner.   If You Need Anything After Your Visit  If you have any questions or concerns for your doctor, please call our main line at [phone] and press option 4 to reach your doctor's medical assistant. If no one answers, please leave a voicemail as directed and we will return your call as soon as possible. Messages left after 4 pm will be answered the following business day.   You may also send us  a message via MyChart. We typically respond to MyChart messages within 1-2 business days.  For prescription refills, please ask your pharmacy to contact our office. Our fax number is [phone].  If you have an urgent issue when the clinic is closed that cannot wait until the next business day, you can page your doctor at the number below.    Please note that while we do our best to be available for urgent issues outside of office hours, we are not available 24/7.   If you have an urgent issue and are unable to reach us , you may choose to seek medical care at your doctor's office, retail clinic,  urgent care center, or emergency room.  If you have a medical emergency, please immediately call 911 or go to the emergency department.  Pager Numbers  - Dr. Hester: [phone]  - Dr. Jackquline: [phone]  - Dr. Claudene: [phone]   - Dr. Raymund: [phone]  In the event of inclement weather, please call our main line at [phone] for an update on the status of any delays or closures.  Dermatology Medication Tips: Please keep the boxes that topical medications come in in order to help keep track of the instructions about where and how to use these. Pharmacies typically print the medication instructions only on the boxes and not directly on the medication tubes.   If your medication is  too expensive, please contact our office at [phone] option 4 or send us  a message through MyChart.   We are unable to tell what your co-pay for medications will be in advance as this is different depending on your insurance coverage. However, we may be able to find a substitute medication at lower cost or fill out paperwork to get insurance to cover a needed medication.   If a prior authorization is required to get your medication covered by your insurance company, please allow us  1-2 business days to complete this process.  Drug prices often vary depending on where the prescription is filled and some pharmacies may offer cheaper prices.  The website www.goodrx.com contains coupons for medications through different pharmacies. The prices here do not account for what the cost may be with help from insurance (it may be cheaper with your insurance), but the website can give you the price if you did not use any insurance.  - You can print the associated coupon and take it with your prescription to the pharmacy.  - You may also stop by our office during regular business hours and pick up a GoodRx coupon card.  - If you need your prescription sent electronically to a different pharmacy, notify our  office through [facility] or by phone at [phone] option 4.     Si Usted Necesita Algo Despus de Su Visita  Tambin puede enviarnos un mensaje a travs de Clinical Cytogeneticist. Por lo general respondemos a los mensajes de MyChart en el transcurso de 1 a 2 das hbiles.  Para renovar recetas, por favor pida a su farmacia que se ponga en contacto con nuestra oficina. Randi lakes de fax es [place] [phone].  Si tiene un asunto urgente cuando la clnica est cerrada y que no puede esperar hasta el siguiente da hbil, puede llamar/localizar a su doctor(a) al nmero que aparece a continuacin.   Por favor, tenga en cuenta que aunque hacemos todo lo posible para estar disponibles para asuntos urgentes fuera del horario de [place], no estamos disponibles las 24 horas del da, los [address]  [phone] de la [place].   Si tiene un problema urgente y no puede comunicarse con nosotros, puede optar por buscar atencin mdica  en el consultorio de su doctor(a), en una clnica privada, en un centro de atencin urgente o en una sala de emergencias.  Si tiene engineer, drilling, por favor llame inmediatamente al 911 o vaya a la sala de emergencias.  Nmeros de bper  - Dr. Hester: [phone]  - Dra. Jackquline: [phone]  - Dr. Claudene: [phone]  - Dra. Kitts: [phone]  En caso de inclemencias del [place], por favor llame a nuestra lnea principal al [phone] para una actualizacin sobre el estado de cualquier retraso o cierre.  Consejos para la medicacin en dermatologa: Por favor, guarde las cajas en las que vienen los medicamentos de uso tpico para ayudarle a seguir las instrucciones sobre dnde y cmo usarlos. Las farmacias generalmente imprimen las instrucciones del medicamento slo en las cajas y no directamente en los tubos del [place].   Si su medicamento es muy caro, por favor, pngase en contacto con landry rieger llamando al [phone] y presione la opcin 4 o envenos un  mensaje a travs de Clinical Cytogeneticist.   No podemos decirle cul ser su copago por los medicamentos por adelantado ya que esto es diferente dependiendo de la cobertura de su seguro. Sin embargo, es posible que podamos encontrar un medicamento sustituto a audiological scientist  un formulario para que el seguro cubra el medicamento que se considera necesario.   Si se requiere una autorizacin previa para que su compaa de seguros cubra su medicamento, por favor permtanos de 1 a 2 das hbiles para completar este proceso.  Los precios de los medicamentos varan con frecuencia dependiendo del environmental consultant de dnde se surte la receta y alguna farmacias pueden ofrecer precios ms baratos.  El sitio web www.goodrx.com tiene cupones para medicamentos de health and safety inspector. Los precios aqu no tienen en cuenta lo que podra costar con la ayuda del seguro (puede ser ms barato con su seguro), pero el sitio web puede darle el precio si no utiliz tourist information centre manager.  - Puede imprimir el cupn correspondiente y llevarlo con su receta a la farmacia.  - Tambin puede pasar por nuestra oficina durante el horario de atencin regular y education officer, museum una tarjeta de cupones de GoodRx.  - Si necesita que su receta se enve electrnicamente a una farmacia diferente, informe a nuestra oficina a travs de MyChart de [place] o por telfono llamando al [phone] y presione la opcin 4.

## 2024-07-01 LAB — SURGICAL PATHOLOGY

## 2024-07-02 ENCOUNTER — Ambulatory Visit: Payer: Self-pay | Admitting: Dermatology

## 2024-07-02 DIAGNOSIS — C44310 Basal cell carcinoma of skin of unspecified parts of face: Secondary | ICD-10-CM

## 2024-07-03 ENCOUNTER — Encounter: Payer: Self-pay | Admitting: Dermatology

## 2024-07-03 NOTE — Telephone Encounter (Signed)
-----   Message from Boneta Sharps, MD sent at 07/02/2024  8:43 AM EST ----- Diagnosis: 1. Skin, right superior forehead :       SUPERFICIAL AND NODULAR BASAL CELL CARCINOMA        2. Skin, left mid cheek :       BASAL CELL CARCINOMA, NODULAR PATTERN        3. Skin, right nasal sidewall :       BASAL CELL CARCINOMA, NODULAR PATTERN        4. Skin, left mid forearm :       SQUAMOUS CELL CARCINOMA IN SITU   Please call daughter with diagnoses and plan  1. R forehead, BCC, Mohs 2. L cheek, BCC, Mohs 3. R nasal side wall, BCC, Mohs 4. L forearm, SCCis, EDC

## 2024-07-03 NOTE — Telephone Encounter (Signed)
 Discussed results with patient's daughter Devere [facility] scheduled, and referrals emailed to Dr. [place] office.

## 2024-07-08 ENCOUNTER — Other Ambulatory Visit: Payer: Self-pay

## 2024-07-08 DIAGNOSIS — C44319 Basal cell carcinoma of skin of other parts of face: Secondary | ICD-10-CM

## 2024-07-08 NOTE — Progress Notes (Signed)
 Corrected referral for Mohs.

## 2024-07-14 ENCOUNTER — Ambulatory Visit: Admitting: Dermatology

## 2024-07-15 NOTE — Discharge Instructions (Signed)

## 2024-07-17 ENCOUNTER — Encounter: Payer: Self-pay | Admitting: Anesthesiology

## 2024-07-17 ENCOUNTER — Ambulatory Visit: Admission: RE | Admit: 2024-07-17 | Discharge: 2024-07-17 | Disposition: A | Source: Home / Self Care

## 2024-07-17 ENCOUNTER — Encounter: Admission: RE | Disposition: A | Discharge status: Home / Self Care | Payer: Self-pay | Source: Home / Self Care

## 2024-07-17 ENCOUNTER — Other Ambulatory Visit: Payer: Self-pay

## 2024-07-17 MED ORDER — TETRACAINE HCL 0.5 % OP SOLN
1.0000 [drp] | OPHTHALMIC | Status: DC | PRN
  Administered 2024-07-17 (×3): 1 [drp] via OPHTHALMIC

## 2024-07-17 MED ORDER — CYCLOPENTOLATE HCL 2 % OP SOLN
OPHTHALMIC | Status: AC
  Filled 2024-07-17: qty 2

## 2024-07-17 MED ORDER — SIGHTPATH DOSE#1 NA CHONDROIT SULF-NA HYALURON 20-15 MG/0.5ML IO SOSY
INTRAOCULAR | Status: DC | PRN
  Administered 2024-07-17: .5 mL via INTRAOCULAR

## 2024-07-17 MED ORDER — SIGHTPATH DOSE#1 NA HYALUR & NA CHOND-NA HYALUR IO KIT
PACK | INTRAOCULAR | Status: DC | PRN
  Administered 2024-07-17: 1 via OPHTHALMIC

## 2024-07-17 MED ORDER — BRIMONIDINE TARTRATE-TIMOLOL 0.2-0.5 % OP SOLN
OPHTHALMIC | Status: DC | PRN
  Administered 2024-07-17: 1 [drp] via OPHTHALMIC

## 2024-07-17 MED ORDER — FENTANYL CITRATE (PF) 100 MCG/2ML IJ SOLN
INTRAMUSCULAR | Status: AC
  Filled 2024-07-17: qty 2

## 2024-07-17 MED ORDER — PHENYLEPHRINE HCL 10 % OP SOLN
1.0000 [drp] | OPHTHALMIC | Status: AC | PRN
  Administered 2024-07-17 (×3): 1 [drp] via OPHTHALMIC

## 2024-07-17 MED ORDER — FENTANYL CITRATE (PF) 100 MCG/2ML IJ SOLN
INTRAMUSCULAR | Status: DC | PRN
  Administered 2024-07-17: 75 ug via INTRAVENOUS
  Administered 2024-07-17: 25 ug via INTRAVENOUS

## 2024-07-17 MED ORDER — SIGHTPATH DOSE#1 BSS IO SOLN
INTRAOCULAR | Status: DC | PRN
  Administered 2024-07-17: 174 mL via OPHTHALMIC

## 2024-07-17 MED ORDER — MOXIFLOXACIN HCL 0.5 % OP SOLN
OPHTHALMIC | Status: DC | PRN
  Administered 2024-07-17: .2 mL via OPHTHALMIC

## 2024-07-17 MED ORDER — CYCLOPENTOLATE HCL 2 % OP SOLN
1.0000 [drp] | OPHTHALMIC | Status: AC | PRN
  Administered 2024-07-17 (×3): 1 [drp] via OPHTHALMIC

## 2024-07-17 MED ORDER — LIDOCAINE HCL (PF) 2 % IJ SOLN
INTRAOCULAR | Status: DC | PRN
  Administered 2024-07-17: 1 mL via INTRAOCULAR

## 2024-07-17 MED ORDER — TETRACAINE HCL 0.5 % OP SOLN
OPHTHALMIC | Status: AC
  Filled 2024-07-17: qty 4

## 2024-07-17 MED ORDER — SIGHTPATH DOSE#1 BSS IO SOLN
INTRAOCULAR | Status: DC | PRN
  Administered 2024-07-17: 15 mL via INTRAOCULAR

## 2024-07-17 MED ORDER — LACTATED RINGERS IV SOLN: INTRAVENOUS | Status: DC

## 2024-07-17 MED ORDER — PHENYLEPHRINE HCL 10 % OP SOLN
OPHTHALMIC | Status: AC
  Filled 2024-07-17: qty 5

## 2024-07-17 NOTE — Anesthesia Preprocedure Evaluation (Signed)
"                                    Anesthesia Evaluation  Patient identified by MRN, date of birth, ID band Patient awake    Reviewed: Allergy & Precautions, H&P , NPO status , Patient's Chart, lab work & pertinent test results  Airway Mallampati: II  TM Distance: >3 FB Neck ROM: Full    Dental no notable dental hx.    Pulmonary neg pulmonary ROS, former smoker   Pulmonary exam normal breath sounds clear to auscultation       Cardiovascular hypertension, negative cardio ROS Normal cardiovascular exam Rhythm:Regular Rate:Normal     Neuro/Psych negative neurological ROS  negative psych ROS   GI/Hepatic negative GI ROS, Neg liver ROS,,,  Endo/Other  negative endocrine ROS    Renal/GU negative Renal ROS  negative genitourinary   Musculoskeletal negative musculoskeletal ROS (+)    Abdominal   Peds negative pediatric ROS (+)  Hematology negative hematology ROS (+)   Anesthesia Other Findings   Reproductive/Obstetrics negative OB ROS                              Anesthesia Physical Anesthesia Plan  ASA: 3  Anesthesia Plan: MAC   Post-op Pain Management:    Induction: Intravenous  PONV Risk Score and Plan:   Airway Management Planned:   Additional Equipment:   Intra-op Plan:   Post-operative Plan: Extubation in OR  Informed Consent: I have reviewed the patients History and Physical, chart, labs and discussed the procedure including the risks, benefits and alternatives for the proposed anesthesia with the patient or authorized representative who has indicated his/her understanding and acceptance.     Dental advisory given  Plan Discussed with: CRNA  Anesthesia Plan Comments:         Anesthesia Quick Evaluation  "

## 2024-07-17 NOTE — Transfer of Care (Signed)
 Immediate Anesthesia Transfer of Care Note  Patient: Dakota Parker  Procedure(s) Performed: PHACOEMULSIFICATION, CATARACT, WITH IOL INSERTION 11:44, 01:28.7 (Right)  Patient Location: PACU  Anesthesia Type: MAC  Level of Consciousness: awake, alert  and patient cooperative  Airway and Oxygen Therapy: Patient Spontanous Breathing and Patient connected to supplemental oxygen  Post-op Assessment: Post-op Vital signs reviewed, Patient's Cardiovascular Status Stable, Respiratory Function Stable, Patent Airway and No signs of Nausea or vomiting  Post-op Vital Signs: Reviewed and stable  Complications: No notable events documented.

## 2024-07-17 NOTE — Anesthesia Postprocedure Evaluation (Signed)
"   Anesthesia Post Note  Patient: Dakota Parker  Procedure(s) Performed: PHACOEMULSIFICATION, CATARACT, WITH IOL INSERTION 11:44, 01:28.7 (Right)  Patient location during evaluation: PACU Anesthesia Type: MAC Level of consciousness: awake and alert Pain management: pain level controlled Vital Signs Assessment: post-procedure vital signs reviewed and stable Respiratory status: spontaneous breathing, nonlabored ventilation, respiratory function stable and patient connected to nasal cannula oxygen Cardiovascular status: blood pressure returned to baseline and stable Postop Assessment: no apparent nausea or vomiting Anesthetic complications: no   No notable events documented.   Last Vitals:  Vitals:   07/17/24 1121 07/17/24 1127  BP: (!) 136/56 131/66  Pulse: (!) 56 (!) 33  Resp: (!) 9 14  Temp: 36.8 C 36.8 C  SpO2: 97% 96%    Last Pain:  Vitals:   07/17/24 1127  TempSrc:   PainSc: 0-No pain                 Fairy A Ifeoma Vallin      "

## 2024-07-17 NOTE — Op Note (Signed)
 PREOPERATIVE DIAGNOSIS:  Nuclear sclerotic cataract of the right eye.   POSTOPERATIVE DIAGNOSIS:  Right Eye Cataract   OPERATIVE PROCEDURE: Phacoemulsification with IOL Implant Right Eye   SURGEON:  Curtistine Fava, MD   ANESTHESIA:  Anesthesiologist: Maggioncalda, Fairy LABOR, MD CRNA: Pearl Ozell PARAS, CRNA  Monitored anesthesia care. Topical tetracaine  drops followed by 2% Xylocaine  jelly applied in the preoperative holding area 0.65ml of epi-Shugarcaine was instilled in the eye following the paracentesis.   COMPLICATIONS:  None.   TECHNIQUE:   Phacoemulsification divide and conquer   DESCRIPTION OF PROCEDURE:  The patient was examined and consented in the preoperative holding area where the aforementioned topical anesthesia was applied to the right eye and then brought back to the Operating Room where the right eye was prepped and draped in the usual sterile ophthalmic fashion and a lid speculum was placed. A paracentesis was created with the side port blade and the anterior chamber was filled with epi-Shugarcaine follower by viscoelastic then a 7.59mm Malyugin ring. A clear corneal incision was performed with the steel keratome. A continuous curvilinear capsulorrhexis was performed with a cystotome followed by the capsulorrhexis forceps. Hydrodissection and hydrodelineation were carried out with BSS on a blunt cannula. The lens was removed in a divide and conquer technique and the remaining cortical material was removed with the irrigation-aspiration handpiece. An anterior rent was noted at 11oclock but it did not radialize.. The capsular bag was inflated with viscoelastic and the DIB00 +19.5D lens was placed in the capsular bag without complication. The Malyugin ring was removed. The remaining viscoelastic was removed from the eye with the irrigation-aspiration handpiece. The wounds were hydrated. The anterior chamber was flushed with BSS and the eye was inflated to physiologic pressure. 0.44ml  of Vigamox  was placed in the anterior chamber. The wounds were found to be water tight. The eye was dressed with Combigan  and covered with a clear shield to be worn until the first postoperative day appointment. The patient was given protective glasses to wear throughout the day. The patient was also given drops with which to begin a drop regimen today and will follow-up with me in one day. Implant Name Type Inv. Item Serial No. Manufacturer Lot No. LRB No. Used Action  LENS IOL TECNIS EYHANCE 19.5 - [id number] Intraocular Lens LENS IOL TECNIS EYHANCE 19.5 [id number] [employer]  Right 1 Implanted   Procedures: PHACOEMULSIFICATION, CATARACT, WITH IOL INSERTION 11:44, 01:28.7 (Right)  Electronically signed: Curtistine PARAS [facility] 07/17/2024 11:18 AM

## 2024-07-17 NOTE — H&P (Signed)
 [facility]   Primary Care Physician:  Cleotilde Oneil FALCON, MD Ophthalmologist: Dr. Curtistine Fava  Pre-Procedure History & Physical: HPI:  Dakota Parker is a 89 y.o. male here for cataract surgery.   Past Medical History:  Diagnosis Date   Basal cell carcinoma 06/26/2024   R sup forehead - referred to Dr. Gregorio for Mohs   Basal cell carcinoma 06/26/2024   L mid cheek - referred to Dr. Gregorio for Mohs   Basal cell carcinoma 06/26/2024   R nasal sidewall - referred to Dr. Gregorio for Mohs   [facility] (basal cell carcinoma) 03/28/2021   right temple, left cheek, Mohs at [facility]   [facility] (basal cell carcinoma) 11/29/2021   nasal tip, Mohs with Dr. Gregorio   [facility] (basal cell carcinoma) 11/02/2022   right lower eyelid, Mohs with Dr. Gregorio   Complication of anesthesia    Hypertension    Kidney stones    Squamous cell carcinoma of skin 06/26/2024   L mid forearm - [facility] scheduled for [facility]    Past Surgical History:  Procedure Laterality Date   KNEE ARTHROSCOPY Left    KNEE ARTHROSCOPY Left 01/04/2015   Procedure: Left knee arthroscopy parital medial menisectomy, chondraplasty,;  Surgeon: Lynwood SHAUNNA Hue, MD;  Location: [facility] ORS;  Service: Orthopedics;  Laterality: Left;    Prior to Admission medications  Medication Sig Start Date End Date Taking? Authorizing Provider  amLODipine (NORVASC) 2.5 MG tablet Take 2.5 mg by mouth every morning. 03/01/24  Yes [provider]  APPLE CIDER VINEGAR PO Take by mouth daily.   Yes [provider]  ELIQUIS 5 MG TABS tablet Take 5 mg by mouth 2 (two) times daily.   Yes [provider]  hydrochlorothiazide (HYDRODIURIL) 25 MG tablet Take 25 mg by mouth daily.   Yes [provider]  latanoprost (XALATAN) 0.005 % ophthalmic solution 1 drop daily. 01/23/22  Yes [provider]  metoprolol succinate (TOPROL-XL) 25 MG 24 hr tablet Take 25 mg by mouth daily. 01/23/24  Yes [provider]   telmisartan (MICARDIS) 80 MG tablet Take 80 mg by mouth. 04/09/24  Yes [provider]  aspirin 81 MG tablet Take 81 mg by mouth 2 (two) times a week. Mon and Fri Patient not taking: Reported on 06/26/2024    [provider]  erythromycin ophthalmic ointment SMARTSIG:In Eye(s) 7 Times Daily 11/09/22   [provider]  lisinopril (PRINIVIL,ZESTRIL) 40 MG tablet Take 40 mg by mouth daily. Patient not taking: Reported on 06/26/2024    [provider]    Allergies as of 06/02/2024   (No Known Allergies)    History reviewed. No pertinent family history.  Social History   Socioeconomic History   Marital status: Married    Spouse name: Not on file   Number of children: Not on file   Years of education: Not on file   Highest education level: Not on file  Occupational History   Not on file  Tobacco Use   Smoking status: Former    Current packs/day: 0.00    Types: Cigarettes    Quit date: 12/28/1967    Years since quitting: 56.5    Passive exposure: Past   Smokeless tobacco: Never  Substance and Sexual Activity   Alcohol use: No   Drug use: No   Sexual activity: Not on file  Other Topics Concern   Not on file  Social History Narrative   Not on file   Social  Drivers of Health   Tobacco Use: Medium Risk (07/17/2024)   Patient History    Smoking Tobacco Use: Former    Smokeless Tobacco Use: Never    Passive Exposure: Past  Physicist, Medical Strain: Low Risk  (01/23/2024)   Received from [facility]   Overall Financial Resource Strain (CARDIA)    Difficulty of Paying Living Expenses: Not hard at all  Food Insecurity: No Food Insecurity (01/23/2024)   Received from [facility]   Epic    Within the past 12 months, you worried that your food would run out before you got the money to buy more.: Never true    Within the past 12 months, the food you bought just didn't last and you didn't have money to get more.:  Never true  Transportation Needs: No Transportation Needs (01/23/2024)   Received from [facility] - Transportation    In the past 12 months, has lack of transportation kept you from medical appointments or from getting medications?: No    Lack of Transportation (Non-Medical): No  Physical Activity: Not on file  Stress: Not on file  Social Connections: Not on file  Intimate Partner Violence: Not on file  Depression (EYV7-0): Not on file  Alcohol Screen: Not on file  Housing: Low Risk  (01/23/2024)   Received from [facility]   Epic    In the last 12 months, was there a time when you were not able to pay the mortgage or rent on time?: No    In the past 12 months, how many times have you moved where you were living?: 0    At any time in the past 12 months, were you homeless or living in a shelter (including now)?: No  Utilities: Not At Risk (01/23/2024)   Received from [facility]   Epic    In the past 12 months has the electric, gas, oil, or water company threatened to shut off services in your home?: No  Health Literacy: Not on file    Review of Systems: See HPI, otherwise negative ROS  Physical Exam: BP (!) 138/57   Pulse (!) 56   Temp 99.5 F (37.5 C) (Temporal)   Resp 17   Ht 6' 0.01 (1.829 m)   Wt 95.4 kg   SpO2 96%   BMI 28.52 kg/m  General:   Alert, cooperative in NAD Head:  Normocephalic and atraumatic. Respiratory:  Normal work of breathing. Cardiovascular:  RRR  Impression/Plan: Dakota Parker is here for cataract surgery RIGHT EYE.  Risks, benefits, limitations, and alternatives regarding cataract surgery have been reviewed with the patient.  Questions have been answered.  All parties agreeable.   Curtistine JINNY Fava, MD  07/17/2024, 10:31 AM

## 2024-07-28 ENCOUNTER — Ambulatory Visit: Admitting: Dermatology

## 2024-07-31 ENCOUNTER — Encounter: Admission: RE | Payer: Self-pay | Source: Home / Self Care

## 2024-07-31 ENCOUNTER — Ambulatory Visit: Admission: RE | Admit: 2024-07-31 | Source: Home / Self Care

## 2024-07-31 SURGERY — PHACOEMULSIFICATION, CATARACT, WITH IOL INSERTION
Anesthesia: Topical | Laterality: Left

## 2024-08-26 ENCOUNTER — Ambulatory Visit: Admitting: Dermatology

## 2024-11-19 ENCOUNTER — Ambulatory Visit: Admitting: Urology
